# Patient Record
Sex: Female | Born: 1950 | Race: Black or African American | Hispanic: No | Marital: Married | Smoking: Former smoker
Health system: Southern US, Community
[De-identification: ages and names within clinical notes are randomized; demographics above are authoritative.]

## PROBLEM LIST (undated history)

## (undated) DIAGNOSIS — D649 Anemia, unspecified: Secondary | ICD-10-CM

## (undated) DIAGNOSIS — K5792 Diverticulitis of intestine, part unspecified, without perforation or abscess without bleeding: Secondary | ICD-10-CM

## (undated) DIAGNOSIS — E7800 Pure hypercholesterolemia, unspecified: Secondary | ICD-10-CM

## (undated) DIAGNOSIS — K219 Gastro-esophageal reflux disease without esophagitis: Secondary | ICD-10-CM

## (undated) DIAGNOSIS — I10 Essential (primary) hypertension: Secondary | ICD-10-CM

## (undated) HISTORY — DX: Essential (primary) hypertension: I10

## (undated) HISTORY — PX: PARTIAL HYSTERECTOMY: SHX80

## (undated) HISTORY — PX: CHOLECYSTECTOMY: SHX55

## (undated) HISTORY — DX: Pure hypercholesterolemia, unspecified: E78.00

## (undated) HISTORY — PX: ABDOMINAL HYSTERECTOMY: SHX81

---

## 2002-04-02 ENCOUNTER — Ambulatory Visit (HOSPITAL_COMMUNITY): Admission: RE | Admit: 2002-04-02 | Discharge: 2002-04-02 | Payer: Self-pay | Admitting: Family Medicine

## 2002-04-02 ENCOUNTER — Encounter: Payer: Self-pay | Admitting: Family Medicine

## 2004-07-21 ENCOUNTER — Ambulatory Visit (HOSPITAL_COMMUNITY): Admission: RE | Admit: 2004-07-21 | Discharge: 2004-07-21 | Payer: Self-pay | Admitting: Family Medicine

## 2008-06-05 ENCOUNTER — Ambulatory Visit (HOSPITAL_COMMUNITY): Admission: RE | Admit: 2008-06-05 | Discharge: 2008-06-05 | Payer: Self-pay | Admitting: Family Medicine

## 2010-07-28 ENCOUNTER — Ambulatory Visit (HOSPITAL_COMMUNITY): Admission: RE | Admit: 2010-07-28 | Discharge: 2010-07-28 | Payer: Self-pay | Admitting: Family Medicine

## 2010-12-16 ENCOUNTER — Encounter: Payer: Self-pay | Admitting: Orthopedic Surgery

## 2011-01-04 ENCOUNTER — Encounter: Payer: Self-pay | Admitting: Orthopedic Surgery

## 2011-01-05 ENCOUNTER — Encounter: Payer: Self-pay | Admitting: Orthopedic Surgery

## 2011-01-05 ENCOUNTER — Ambulatory Visit (INDEPENDENT_AMBULATORY_CARE_PROVIDER_SITE_OTHER): Payer: Managed Care, Other (non HMO) | Admitting: Orthopedic Surgery

## 2011-01-05 VITALS — HR 78 | Resp 16 | Ht 66.0 in | Wt 192.0 lb

## 2011-01-05 DIAGNOSIS — M23329 Other meniscus derangements, posterior horn of medial meniscus, unspecified knee: Secondary | ICD-10-CM

## 2011-01-05 DIAGNOSIS — M1712 Unilateral primary osteoarthritis, left knee: Secondary | ICD-10-CM

## 2011-01-05 MED ORDER — NABUMETONE 500 MG PO TABS: 500.0000 mg | ORAL_TABLET | Freq: Two times a day (BID) | ORAL | Status: AC

## 2011-01-05 NOTE — Progress Notes (Signed)
General: The patient is normally developed, with normal grooming and hygiene. There are no gross deformities. The body habitus is normal   CDV: The pulse and perfusion of the extremities are normal   LYMPH: There is no gross lymphadenopathy in the extremities   Skin: There are no rashes, ulcers or cafe-au-lait spot   Psyche: The patient is alert, awake and oriented.  Mood is normal   Neuro:  The coordination and balance are normal.  Sensation is normal. Reflexes are 2+ and equal   Musculoskeletal  LEFT knee exam.  Medial joint line tenderness moderate joint effusion. Flexion measured 124. Muscle tone normal strength normal. Meniscal signs negative. Knee is stable to

## 2011-01-05 NOTE — Discharge Summary (Signed)
General: The patient is normally developed, with normal grooming and hygiene. There are no gross deformities. The body habitus is normal   CDV: The pulse and perfusion of the extremities are normal   LYMPH: There is no gross lymphadenopathy in the extremities   Skin: There are no rashes, ulcers or cafe-au-lait spot   Psyche: The patient is alert, awake and oriented.  Mood is normal   Neuro:  The coordination and balance are normal.  Sensation is normal. Reflexes are 2+ and equal   Musculoskeletal   LEFT knee examination. There is tenderness along the medial join line. There is a large join effusion.124 measured. Range of motion in the LEFT knee. The knee is stable. Meniscal signs are negative. Muscle tone is normal.

## 2011-01-05 NOTE — Patient Instructions (Signed)
Start new medication, call us if you have any itching or rash and stop taking the medicine   Apply ice to the knee in the evenings, wear compression sleeve as needed   Call us of you have trouble bending your knee

## 2011-03-19 ENCOUNTER — Emergency Department (HOSPITAL_COMMUNITY)
Admission: EM | Admit: 2011-03-19 | Discharge: 2011-03-19 | Disposition: A | Discharge status: Home / Self Care | Payer: 59 | Attending: Emergency Medicine | Admitting: Emergency Medicine

## 2011-03-19 DIAGNOSIS — I10 Essential (primary) hypertension: Secondary | ICD-10-CM | POA: Insufficient documentation

## 2011-03-19 DIAGNOSIS — D649 Anemia, unspecified: Secondary | ICD-10-CM | POA: Insufficient documentation

## 2011-03-19 DIAGNOSIS — K920 Hematemesis: Secondary | ICD-10-CM | POA: Insufficient documentation

## 2011-03-19 LAB — CBC
HCT: 34.9 % — ABNORMAL LOW (ref 36.0–46.0)
Hemoglobin: 10.3 g/dL — ABNORMAL LOW (ref 12.0–15.0)
MCH: 22 pg — ABNORMAL LOW (ref 26.0–34.0)
MCHC: 29.5 g/dL — ABNORMAL LOW (ref 30.0–36.0)
MCV: 74.4 fL — ABNORMAL LOW (ref 78.0–100.0)
Platelets: 252 10*3/uL (ref 150–400)
RBC: 4.69 MIL/uL (ref 3.87–5.11)
RDW: 16 % — ABNORMAL HIGH (ref 11.5–15.5)
WBC: 7.7 10*3/uL (ref 4.0–10.5)

## 2011-03-19 LAB — BASIC METABOLIC PANEL
BUN: 16 mg/dL (ref 6–23)
CO2: 27 mEq/L (ref 19–32)
Calcium: 10.4 mg/dL (ref 8.4–10.5)
Chloride: 104 mEq/L (ref 96–112)
Creatinine, Ser: 0.76 mg/dL (ref 0.4–1.2)
GFR calc Af Amer: >60 mL/min (ref >60)
GFR calc non Af Amer: >60 mL/min (ref >60)
Glucose, Bld: 129 mg/dL — ABNORMAL HIGH (ref 70–99)
Potassium: 4 mEq/L (ref 3.5–5.1)
Sodium: 138 mEq/L (ref 135–145)

## 2012-08-09 ENCOUNTER — Encounter: Payer: Self-pay | Admitting: Gastroenterology

## 2012-08-09 ENCOUNTER — Ambulatory Visit (INDEPENDENT_AMBULATORY_CARE_PROVIDER_SITE_OTHER): Payer: 59 | Admitting: Gastroenterology

## 2012-08-09 ENCOUNTER — Other Ambulatory Visit: Payer: Self-pay | Admitting: Gastroenterology

## 2012-08-09 VITALS — BP 153/67 | HR 93 | Temp 98.7°F | Ht 66.0 in | Wt 209.4 lb

## 2012-08-09 DIAGNOSIS — R143 Flatulence: Secondary | ICD-10-CM

## 2012-08-09 DIAGNOSIS — R140 Abdominal distension (gaseous): Secondary | ICD-10-CM

## 2012-08-09 DIAGNOSIS — D509 Iron deficiency anemia, unspecified: Secondary | ICD-10-CM | POA: Insufficient documentation

## 2012-08-09 DIAGNOSIS — R195 Other fecal abnormalities: Secondary | ICD-10-CM

## 2012-08-09 DIAGNOSIS — K625 Hemorrhage of anus and rectum: Secondary | ICD-10-CM | POA: Insufficient documentation

## 2012-08-09 MED ORDER — PEG 3350-KCL-NA BICARB-NACL 420 G PO SOLR: 4000.0000 mL | ORAL | Status: DC

## 2012-08-09 NOTE — Patient Instructions (Addendum)
I will request a copy of your most recent labs from Dr. Michelle Nasuti office. If any further labs are needed we will let you know. We have scheduled you for a colonoscopy with possible upper endoscopy with Dr. Darrick Penna. Please see separate instructions.

## 2012-08-09 NOTE — Progress Notes (Signed)
Primary Care Physician:  Milana Obey, MD  Primary Gastroenterologist:  Jonette Eva, MD   Chief Complaint  Patient presents with  . Colonoscopy  . Rectal Bleeding    HPI:  Kelly Rios is a 61 y.o. female here for further evaluation of Hemoccult-positive stool, abdominal swelling/bloating, rectal bleeding.  Recently some straining to have BM. Has noted brbpr on toilet tissue and on stool. No rectal pain. No abdominal pain. Good appetite. Occasional heartburn. Tries to avoid spicy foods. No dysphagia. Some intermittent regurgitation. No prior colonoscopy. Recent blood work through [facility]. No menstrual cycle since age 63. On iron for one year, but not consistently. Looking back through [facility], she was seen in the emergency department in June 2012 for hematemesis. Patient reports episode of vomiting with minimal blood noted which occurred after taking doxycycline. She felt like the pill did not go down well and she had burning in her esophagus related to this. However when she was in ED she was noted to have microcytic anemia and at that point was placed on iron. No EGD or colonoscopy performed. Patient also complains of chronic fatigue, abdominal swelling/bloating/gas.   Current Outpatient Prescriptions  Medication Sig Dispense Refill  . bisoprolol-hydrochlorothiazide (ZIAC) 10-6.25 MG per tablet Take 1 tablet by mouth daily.      . calcium & magnesium carbonates (MYLANTA) 311-232 MG per tablet Take 1 tablet by mouth daily.      . potassium chloride (K-DUR,KLOR-CON) 10 MEQ tablet Take 10 mEq by mouth 2 (two) times daily.        Allergies as of 08/09/2012 - Review Complete 08/09/2012  Allergen Reaction Noted  . Codeine  01/05/2011    Past Medical History  Diagnosis Date  . HTN (hypertension)   . High cholesterol     Past Surgical History  Procedure Date  . Gallbladder surgery   . Partial hysterectomy     Family History  Problem Relation Age of Onset  . Asthma    . Diabetes     . Colon cancer Maternal Grandmother     8s  . Liver disease Neg Hx     History   Social History  . Marital Status: Married    Spouse Name: N/A    Number of Children: 4  . Years of Education: N/A   Occupational History  . cleaner   . picker    Social History Main Topics  . Smoking status: Former Games developer  . Smokeless tobacco: Not on file   Comment: remote  . Alcohol Use: No  . Drug Use: No  . Sexually Active: Not on file   Other Topics Concern  . Not on file   Social History Narrative  . No narrative on file      ROS:  General: Negative for anorexia, weight loss, fever, chills. Complains of fatigue, weakness. Eyes: Negative for vision changes.  ENT: Negative for hoarseness, difficulty swallowing , nasal congestion. CV: Negative for chest pain, angina, palpitations, dyspnea on exertion, peripheral edema.  Respiratory: Negative for dyspnea at rest, dyspnea on exertion, cough, sputum, wheezing.  GI: See history of present illness. GU:  Negative for dysuria, hematuria, urinary incontinence, urinary frequency, nocturnal urination.  MS: Negative for joint pain, low back pain.  Derm: Negative for rash or itching.  Neuro: Negative for weakness, abnormal sensation, seizure, frequent headaches, memory loss, confusion.  Psych: Negative for anxiety, depression, suicidal ideation, hallucinations.  Endo: Negative for unusual weight change.  Heme: Negative for bruising or bleeding. Allergy: Negative  for rash or hives.    Physical Examination:  BP 153/67  Pulse 93  Temp 98.7 F (37.1 C) (Temporal)  Ht 5\' 6"  (1.676 m)  Wt 209 lb 6.4 oz (94.983 kg)  BMI 33.80 kg/m2   General: Well-nourished, well-developed in no acute distress.  Head: Normocephalic, atraumatic.   Eyes: Conjunctiva pink, no icterus. Mouth: Oropharyngeal mucosa moist and pink , no lesions erythema or exudate. Neck: Supple without thyromegaly, masses, or lymphadenopathy.  Lungs: Clear to auscultation  bilaterally.  Heart: Regular rate and rhythm, no murmurs rubs or gallops.  Abdomen: Bowel sounds are normal, nontender, nondistended, no hepatosplenomegaly or masses, no abdominal bruits or    hernia , no rebound or guarding.   Rectal: not performed Extremities: No lower extremity edema. No clubbing or deformities.  Neuro: Alert and oriented x 4 , grossly normal neurologically.  Skin: Warm and dry, no rash or jaundice.   Psych: Alert and cooperative, normal mood and affect.  Labs:  OLD LABS FROM 03/2011 WHEN PATIENT PRESENTED TO ED WITH HEMATEMESIS.  Lab Results  Component Value Date   WBC 7.7 03/19/2011   HGB 10.3* 03/19/2011   HCT 34.9* 03/19/2011   MCV 74.4* 03/19/2011   PLT 252 03/19/2011     Imaging Studies: No results found.

## 2012-08-09 NOTE — Progress Notes (Signed)
Faxed to PCP

## 2012-08-09 NOTE — Assessment & Plan Note (Signed)
Recent Hemoccult positive stool, intermittent rectal bleeding. Interestingly patient was noted to have microcytic anemia one year ago and was placed on chronic iron therapy. Extent of workup unknown to me but she did not undergo EGD or colonoscopy at that time. There is no other signs for chronic blood loss. She has had no menstrual cycle since partial hysterectomy in her late 68s. She also complains of vague abdominal bloating and gas. She reports that there was a plan for CT scan but this has not been done yet. According to her Dr. Sudie Bailey advised her to place on hold until after procedure.  Recommend colonoscopy with possible upper endoscopy in the near future with Dr. Darrick Penna.  I have discussed the risks, alternatives, benefits with regards to but not limited to the risk of reaction to medication, bleeding, infection, perforation and the patient is agreeable to proceed. Written consent to be obtained.  I have requested copy of latest labs from PCP. If no updated H/H, we will order.

## 2012-08-10 ENCOUNTER — Other Ambulatory Visit: Payer: Self-pay | Admitting: Gastroenterology

## 2012-08-10 DIAGNOSIS — R195 Other fecal abnormalities: Secondary | ICD-10-CM

## 2012-08-10 DIAGNOSIS — R140 Abdominal distension (gaseous): Secondary | ICD-10-CM

## 2012-08-10 DIAGNOSIS — K625 Hemorrhage of anus and rectum: Secondary | ICD-10-CM

## 2012-08-18 ENCOUNTER — Encounter (HOSPITAL_COMMUNITY): Payer: Self-pay | Admitting: Pharmacy Technician

## 2012-08-25 ENCOUNTER — Encounter (HOSPITAL_COMMUNITY): Payer: Self-pay | Admitting: *Deleted

## 2012-08-25 ENCOUNTER — Encounter (HOSPITAL_COMMUNITY)
Admission: RE | Disposition: A | Discharge status: Home / Self Care | Payer: Self-pay | Source: Ambulatory Visit | Attending: Gastroenterology

## 2012-08-25 ENCOUNTER — Ambulatory Visit (HOSPITAL_COMMUNITY)
Admission: RE | Admit: 2012-08-25 | Discharge: 2012-08-25 | Disposition: A | Discharge status: Home / Self Care | Payer: 59 | Source: Ambulatory Visit | Attending: Gastroenterology | Admitting: Gastroenterology

## 2012-08-25 DIAGNOSIS — K625 Hemorrhage of anus and rectum: Secondary | ICD-10-CM

## 2012-08-25 DIAGNOSIS — D649 Anemia, unspecified: Secondary | ICD-10-CM

## 2012-08-25 DIAGNOSIS — K222 Esophageal obstruction: Secondary | ICD-10-CM | POA: Insufficient documentation

## 2012-08-25 DIAGNOSIS — K5730 Diverticulosis of large intestine without perforation or abscess without bleeding: Secondary | ICD-10-CM | POA: Insufficient documentation

## 2012-08-25 DIAGNOSIS — I10 Essential (primary) hypertension: Secondary | ICD-10-CM | POA: Insufficient documentation

## 2012-08-25 DIAGNOSIS — K2940 Chronic atrophic gastritis without bleeding: Secondary | ICD-10-CM | POA: Insufficient documentation

## 2012-08-25 DIAGNOSIS — A048 Other specified bacterial intestinal infections: Secondary | ICD-10-CM | POA: Insufficient documentation

## 2012-08-25 DIAGNOSIS — K921 Melena: Secondary | ICD-10-CM | POA: Insufficient documentation

## 2012-08-25 DIAGNOSIS — R140 Abdominal distension (gaseous): Secondary | ICD-10-CM

## 2012-08-25 DIAGNOSIS — K648 Other hemorrhoids: Secondary | ICD-10-CM | POA: Insufficient documentation

## 2012-08-25 DIAGNOSIS — R195 Other fecal abnormalities: Secondary | ICD-10-CM

## 2012-08-25 DIAGNOSIS — K257 Chronic gastric ulcer without hemorrhage or perforation: Secondary | ICD-10-CM | POA: Insufficient documentation

## 2012-08-25 DIAGNOSIS — K2990 Gastroduodenitis, unspecified, without bleeding: Secondary | ICD-10-CM

## 2012-08-25 DIAGNOSIS — K2970 Gastritis, unspecified, without bleeding: Secondary | ICD-10-CM

## 2012-08-25 HISTORY — DX: Gastro-esophageal reflux disease without esophagitis: K21.9

## 2012-08-25 HISTORY — PX: COLONOSCOPY: SHX5424

## 2012-08-25 HISTORY — DX: Anemia, unspecified: D64.9

## 2012-08-25 HISTORY — PX: ESOPHAGOGASTRODUODENOSCOPY: SHX5428

## 2012-08-25 SURGERY — COLONOSCOPY
Anesthesia: Moderate Sedation

## 2012-08-25 MED ORDER — MEPERIDINE HCL 100 MG/ML IJ SOLN
INTRAMUSCULAR | Status: AC
  Filled 2012-08-25: qty 1

## 2012-08-25 MED ORDER — STERILE WATER FOR IRRIGATION IR SOLN
Status: DC | PRN
  Administered 2012-08-25: 13:00:00

## 2012-08-25 MED ORDER — MEPERIDINE HCL 100 MG/ML IJ SOLN
INTRAMUSCULAR | Status: DC | PRN
  Administered 2012-08-25: 50 mg via INTRAVENOUS
  Administered 2012-08-25 (×2): 25 mg via INTRAVENOUS

## 2012-08-25 MED ORDER — MIDAZOLAM HCL 5 MG/5ML IJ SOLN
INTRAMUSCULAR | Status: DC | PRN
  Administered 2012-08-25 (×2): 2 mg via INTRAVENOUS
  Administered 2012-08-25: 1 mg via INTRAVENOUS

## 2012-08-25 MED ORDER — SODIUM CHLORIDE 0.45 % IV SOLN: INTRAVENOUS | Status: DC

## 2012-08-25 MED ORDER — MIDAZOLAM HCL 5 MG/5ML IJ SOLN
INTRAMUSCULAR | Status: AC
  Filled 2012-08-25: qty 10

## 2012-08-25 MED ORDER — OMEPRAZOLE 20 MG PO CPDR: DELAYED_RELEASE_CAPSULE | ORAL | Status: DC

## 2012-08-25 NOTE — Op Note (Signed)
[facility] [address] [place], [address]   COLONOSCOPY PROCEDURE REPORT  PATIENT: Kelly Rios, Kelly Rios.  MR#: [id number] BIRTHDATE: April 26, 1951 , 61  yrs. old GENDER: Female ENDOSCOPIST: Jonette Eva, MD REFERRED HY:QMVHQ Sudie Bailey, M.D. PROCEDURE DATE:  08/25/2012 PROCEDURE:   ILEOColonoscopy, screening INDICATIONS:rectal bleeding. MEDICATIONS: Demerol 75 mg IV and Versed 5 mg IV  DESCRIPTION OF PROCEDURE:    Physical exam was performed.  Informed consent was obtained from the patient after explaining the benefits, risks, and alternatives to procedure.  The patient was connected to monitor and placed in left lateral position. Continuous oxygen was provided by nasal cannula and IV medicine administered through an indwelling cannula.  After administration of sedation and rectal exam, the patients rectum was intubated and the Pentax Colonoscope [id number]  colonoscope was advanced under direct visualization to the ileum.  The scope was removed slowly by carefully examining the color, texture, anatomy, and integrity mucosa on the way out.  The patient was recovered in endoscopy and discharged home in satisfactory condition.       COLON FINDINGS: The mucosa appeared normal in the terminal ileum.  , Mild diverticulosis was noted in the ascending colon and transverse colon.  , Moderate diverticulosis was noted in the descending colon and sigmoid colon.  , The colon mucosa was otherwise normal.  , and Small internal hemorrhoids were found.  PREP QUALITY: excellent. CECAL W/D TIME: 15 minutes  COMPLICATIONS: None  ENDOSCOPIC IMPRESSION: 1.   Normal mucosa in the terminal ileum 2.   Mild diverticulosis was noted in the ascending colon and transverse colon 3.   Moderate diverticulosis was noted in the descending colon and sigmoid colon 4.   The colon mucosa was otherwise normal 5.   Small internal hemorrhoids   RECOMMENDATIONS: NO SOURCE FOR ANEMIA/HEME POS  STOOLS IDENTIFIED, PROCEED WITH EGD HIGH FIBER DIET TCS IN 10 YEARS       _______________________________ eSignedJonette Eva, MD 08/25/2012 2:12 PM     PATIENT NAME:  Kelly Rios. MR#: [id number]

## 2012-08-25 NOTE — H&P (Signed)
  Primary Care Physician:  Milana Obey, MD Primary Gastroenterologist:  Dr. Darrick Penna  Pre-Procedure History & Physical: HPI:  Kelly Rios is a 61 y.o. female here for  BRBPR/ANEMIA/HEMATEMESIS.  Past Medical History  Diagnosis Date  . HTN (hypertension)   . High cholesterol   . Anemia   . GERD (gastroesophageal reflux disease)     Past Surgical History  Procedure Date  . Partial hysterectomy   . Abdominal hysterectomy   . Cholecystectomy     Prior to Admission medications   Medication Sig Start Date End Date Taking? Authorizing Provider  bisoprolol-hydrochlorothiazide (ZIAC) 10-6.25 MG per tablet Take 1 tablet by mouth daily.   Yes Historical Provider, MD  calcium & magnesium carbonates (MYLANTA) 311-232 MG per tablet Take 1 tablet by mouth daily.   Yes Historical Provider, MD  cyclobenzaprine (FLEXERIL) 10 MG tablet Take 10 mg by mouth 3 (three) times daily as needed. Muscle spasms.   Yes Historical Provider, MD  ferrous sulfate 325 (65 FE) MG tablet Take 325 mg by mouth daily with breakfast.   Yes Historical Provider, MD  Homeopathic Products ([employer] LEG CRAMPS PAIN RELIEF PO) Take 2 tablets by mouth every 3 (three) hours as needed. Leg cramps   Yes Historical Provider, MD  potassium chloride (K-DUR,KLOR-CON) 10 MEQ tablet Take 10 mEq by mouth 2 (two) times daily.   Yes Historical Provider, MD  predniSONE (DELTASONE) 10 MG tablet Take 10 mg by mouth 3 (three) times daily.   Yes Historical Provider, MD    Allergies as of 08/10/2012 - Review Complete 08/09/2012  Allergen Reaction Noted  . Codeine  01/05/2011    Family History  Problem Relation Age of Onset  . Asthma    . Diabetes    . Colon cancer Maternal Grandmother     65s  . Liver disease Neg Hx     History   Social History  . Marital Status: Married    Spouse Name: N/A    Number of Children: 4  . Years of Education: N/A   Occupational History  . cleaner   . picker    Social History Main Topics  .  Smoking status: Former Smoker -- 0.5 packs/day for 15 years    Types: Cigarettes  . Smokeless tobacco: Not on file     Comment: remote  . Alcohol Use: No  . Drug Use: No  . Sexually Active: Not on file   Other Topics Concern  . Not on file   Social History Narrative  . No narrative on file    Review of Systems: See HPI, otherwise negative ROS   Physical Exam: BP 173/80  Pulse 74  Temp 97.6 F (36.4 C) (Oral)  Resp 14  Ht 5\' 6"  (1.676 m)  Wt 209 lb (94.802 kg)  BMI 33.73 kg/m2  SpO2 97% General:   Alert,  pleasant and cooperative in NAD Head:  Normocephalic and atraumatic. Neck:  Supple; Lungs:  Clear throughout to auscultation.    Heart:  Regular rate and rhythm. Abdomen:  Soft, nontender and nondistended. Normal bowel sounds, without guarding, and without rebound.   Neurologic:  Alert and  oriented x4;  grossly normal neurologically.  Impression/Plan:     BRBPR/hematemesis sml volume.  PLAN: EGD/TCS TODAY

## 2012-08-25 NOTE — Op Note (Signed)
[facility] [address] [place], [address]   ENDOSCOPY PROCEDURE REPORT  PATIENT: Carolette, Delrio.  MR#: [id number] BIRTHDATE: 28-Jun-1951 , 61  yrs. old GENDER: Female  ENDOSCOPIST: Jonette Eva, MD REFERRED QI:ONGEX Sudie Bailey, M.D.  PROCEDURE DATE: 08/25/2012 PROCEDURE:   EGD w/ biopsy  INDICATIONS:ANEMIA, HEME POS STOOLS-PMHx: CRI. MEDICATIONS: Demerol 25 mg IV TOPICAL ANESTHETIC:   Cetacaine Spray  DESCRIPTION OF PROCEDURE:     Physical exam was performed.  Informed consent was obtained from the patient after explaining the benefits, risks, and alternatives to the procedure.  The patient was connected to the monitor and placed in the left lateral position.  Continuous oxygen was provided by nasal cannula and IV medicine administered through an indwelling cannula.  After administration of sedation, the patients esophagus was intubated and the [id number] ([id number])  endoscope was advanced under direct visualization to the second portion of the duodenum.  The scope was removed slowly by carefully examining the color, texture, anatomy, and integrity of the mucosa on the way out.  The patient was recovered in endoscopy and discharged home in satisfactory condition.      ESOPHAGUS: A stricture was found at the gastroesophageal junction. The stenosis was traversable with the endoscope.  STOMACH: Multiple medium sized non-bleeding linear ulcers were found in the gastric fundus and gastric body.  Biopsies were taken around the ulcers.   Moderate non-erosive gastritis (inflammation) was found in the gastric antrum.  Multiple biopsies were performed.  DUODENUM: Mild duodenal inflammation was found in the bulb and second portion of the duodenum. COMPLICATIONS:   None  ENDOSCOPIC IMPRESSION: 1.   Stricture was found at the gastroesophageal junction 2.   Multiple medium sized non-bleeding ulcers were found in the gastric fundus and gastric body 3.   Non-erosive  gastritis (inflammation) was found in the gastric antrum; multiple biopsies 4.   Duodenal inflammation was found in the bulb and second portion of the duodenum  RECOMMENDATIONS: ANEMIA/HEME POS STOOLS MOST LIKELY DUE TO CAMERON'S ULCERS. DIFFERENTIAL DIAGNSOSIS INCLUDES NSAID INDUCED PUD OR H PYLORI INFECTION.  OMEPRAZOLE QD AWAIT BIOPSY LOW FAT/HIGH FIBER DIET LOSE 30 LBS. OPV IN 3 MOS. CALL IF DEVELOPS DYSPHAGIA AND WILL ARANGE FOR EGD/DIL.   REPEAT EXAM:   _______________________________ Rosalie DoctorJonette Eva, MD 08/25/2012 3:18 PM       PATIENT NAME:  [name]. MR#: [id number]

## 2012-08-30 ENCOUNTER — Telehealth: Payer: Self-pay | Admitting: Gastroenterology

## 2012-08-30 ENCOUNTER — Encounter (HOSPITAL_COMMUNITY): Payer: Self-pay | Admitting: Gastroenterology

## 2012-08-30 MED ORDER — CLARITHROMYCIN 500 MG PO TABS: ORAL_TABLET | ORAL | Status: DC

## 2012-08-30 MED ORDER — AMOXICILLIN 500 MG PO CAPS: ORAL_CAPSULE | ORAL | Status: DC

## 2012-08-30 NOTE — Progress Notes (Signed)
EGD/TCS NOV 2013 H PYLORI  REVIEWED.

## 2012-08-30 NOTE — Telephone Encounter (Signed)
Results faxed to PCP, recall made  

## 2012-08-30 NOTE — Telephone Encounter (Signed)
LM for pt to call

## 2012-08-30 NOTE — Telephone Encounter (Signed)
PLEASE CALL PT.  Her stomach Bx showed H. Pylori infection. THIS IS CONTRIBUTING TO HER LOW BLOOD COUNT. She needs AMOXICILLIN 500 mg 2 po BID for 10 days and Biaxin 500 mg po bid for 10 days. RX FOR ABX SNE TO [facility]. She needs Omeprazole 20 mg BID for 10 days then 1 po QD. Med side effects include NVD, abd pain, and metallic taste.  OPV IN 3 MO W/ SLF ANEMIA.

## 2012-08-31 NOTE — Telephone Encounter (Signed)
Pt called and was informed of results.  

## 2012-08-31 NOTE — Telephone Encounter (Signed)
LMOM to call.

## 2016-09-14 ENCOUNTER — Other Ambulatory Visit: Payer: Self-pay | Admitting: *Deleted

## 2016-09-14 DIAGNOSIS — I83893 Varicose veins of bilateral lower extremities with other complications: Secondary | ICD-10-CM

## 2016-09-29 ENCOUNTER — Other Ambulatory Visit: Payer: Self-pay | Admitting: *Deleted

## 2016-09-29 DIAGNOSIS — I83813 Varicose veins of bilateral lower extremities with pain: Secondary | ICD-10-CM

## 2016-10-08 ENCOUNTER — Encounter: Payer: Self-pay | Admitting: Vascular Surgery

## 2016-10-26 ENCOUNTER — Ambulatory Visit (HOSPITAL_COMMUNITY)
Admission: RE | Admit: 2016-10-26 | Discharge: 2016-10-26 | Disposition: A | Discharge status: Home / Self Care | Payer: BLUE CROSS/BLUE SHIELD | Source: Ambulatory Visit | Attending: Vascular Surgery | Admitting: Vascular Surgery

## 2016-10-26 ENCOUNTER — Encounter: Payer: Self-pay | Admitting: Vascular Surgery

## 2016-10-26 ENCOUNTER — Ambulatory Visit (INDEPENDENT_AMBULATORY_CARE_PROVIDER_SITE_OTHER): Payer: BLUE CROSS/BLUE SHIELD | Admitting: Vascular Surgery

## 2016-10-26 VITALS — BP 148/83 | HR 62 | Temp 98.5°F | Resp 18 | Ht 66.0 in | Wt 200.0 lb

## 2016-10-26 DIAGNOSIS — I83893 Varicose veins of bilateral lower extremities with other complications: Secondary | ICD-10-CM

## 2016-10-26 NOTE — Progress Notes (Signed)
Subjective:     Patient ID: Kelly Rios, female   DOB: 07/14/1951, 66 y.o.   MRN: [id number]  HPI This 66 year old female was referred by Dr. Gareth MorganSteve Knowlton for evaluation of bilateral painful varicosities. Patient has had no recent treatment. She did have localized removal of the varicose vein in her right calf Dr. Shon Houghruesdale many years ago-greater than 20. She also went to [facility] to be evaluated and was found to be out of insurance network. She has aching throbbing and burning discomfort in both legs which worsens as the day progresses. She does wear a long leg elastic compression stockings 20-30 millimeter gradient which she purchased at [facility] and has been wearing these for greater than 3 months. He continues to have symptoms despite the stockings. She has no history of DVT thrombophlebitis stasis ulcers or bleeding. This is affecting her daily living. When she works she stands all day and this causes her symptoms do worsen as the day progresses.  Past Medical History:  Diagnosis Date  . Anemia   . GERD (gastroesophageal reflux disease)   . High cholesterol   . HTN (hypertension)     Social History  Substance Use Topics  . Smoking status: Former Smoker    Packs/day: 0.50    Years: 15.00    Types: Cigarettes  . Smokeless tobacco: Never Used     Comment: remote  . Alcohol use No    Family History  Problem Relation Age of Onset  . Asthma    . Diabetes    . Colon cancer Maternal Grandmother     5880s  . Liver disease Neg Hx     Allergies  Allergen Reactions  . Codeine Shortness Of Breath     Current Outpatient Prescriptions:  .  bisoprolol-hydrochlorothiazide (ZIAC) 10-6.25 MG per tablet, Take 1 tablet by mouth daily., Disp: , Rfl:  .  calcium & magnesium carbonates (MYLANTA) 311-232 MG per tablet, Take 1 tablet by mouth daily., Disp: , Rfl:  .  cyclobenzaprine (FLEXERIL) 10 MG tablet, Take 10 mg by mouth 3 (three) times daily as needed. Muscle spasms., Disp: ,  Rfl:  .  ferrous sulfate 325 (65 FE) MG tablet, Take 325 mg by mouth daily with breakfast., Disp: , Rfl:  .  Homeopathic Products ([employer] LEG CRAMPS PAIN RELIEF PO), Take 2 tablets by mouth every 3 (three) hours as needed. Leg cramps, Disp: , Rfl:  .  omeprazole (PRILOSEC) 20 MG capsule, 1 po every morning, Disp: 31 capsule, Rfl: 11 .  potassium chloride (K-DUR,KLOR-CON) 10 MEQ tablet, Take 10 mEq by mouth 2 (two) times daily., Disp: , Rfl:  .  amoxicillin (AMOXIL) 500 MG capsule, 2 PO BID FOR 10 DAYS (Patient not taking: Reported on 10/26/2016), Disp: 40 capsule, Rfl: 0 .  clarithromycin (BIAXIN) 500 MG tablet, 1 PO BID FOR 10 DAYS. (Patient not taking: Reported on 10/26/2016), Disp: 20 tablet, Rfl: 0 .  predniSONE (DELTASONE) 10 MG tablet, Take 10 mg by mouth 3 (three) times daily., Disp: , Rfl:   Vitals:   10/26/16 1450 10/26/16 1455  BP: (!) 150/84 (!) 148/83  Pulse: 62   Resp: 18   Temp: 98.5 F (36.9 C)   TempSrc: Oral   SpO2: 96%   Weight: 200 lb (90.7 kg)   Height: 5\' 6"  (1.676 m)     Body mass index is 32.28 kg/m.         Review of Systems Denies chest pain, dyspnea on exertion, PND, orthopnea,  hemoptysis. Patient does have hypertension, hyperlipidemia, diabetes mellitus type 2. Has history of anxiety disorder, other systems negative and complete review of systems    Objective:   Physical Exam BP (!) 148/83 Comment: recheck  Pulse 62   Temp 98.5 F (36.9 C) (Oral)   Resp 18   Ht 5\' 6"  (1.676 m)   Wt 200 lb (90.7 kg)   SpO2 96%   BMI 32.28 kg/m     Gen.-alert and oriented x3 in no apparent distress HEENT normal for age Lungs no rhonchi or wheezing Cardiovascular regular rhythm no murmurs carotid pulses 3+ palpable no bruits audible Abdomen soft nontender no palpable masses Musculoskeletal free of  major deformities Skin clear -no rashes Neurologic normal Lower extremities 3+ femoral and dorsalis pedis pulses palpable bilaterally with 1+ edema  bilaterally Left leg with large bulging varicosities beginning in the mid thigh extending laterally distally and around the knee and into the lateral calf with extensive spider and reticular veins in the lower leg down to the malleolar areas. No hyperpigmentation or ulceration noted. Right leg with similar distribution of bulging varicosities medial thigh and large bulge in medial posterior calf with extensive reticular and spider veins below the knee but no active ulcer  Today I ordered a venous duplex exam both legs which I reviewed and interpreted. 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 The symptoms are resistant to conservative measures including long-leg elastic compression stockings 20-30 millimeter gradient, elevation, and ibuprofen    Plan:     Patient needs #1 laser ablation left great saphenous vein plus greater than 20 stab phlebectomy followed by 2 courses of sclerotherapy. She also needs #2 laser ablation right great saphenous vein plus greater than 20 stab phlebectomy followed by 2 courses of sclerotherapy  We will proceed with precertification to form this in the near future and relieve her symptoms.

## 2016-10-27 ENCOUNTER — Telehealth: Payer: Self-pay | Admitting: *Deleted

## 2016-10-27 NOTE — Telephone Encounter (Signed)
I checked the patient's benefits per her request to see what the procedures would cost. After telling the patient my findings, she has decided to wait until the summer when she is on Medicare and has a supplement. We will make her a three month fu visit in June.

## 2017-03-23 ENCOUNTER — Encounter: Payer: Self-pay | Admitting: Vascular Surgery

## 2017-03-28 ENCOUNTER — Ambulatory Visit (INDEPENDENT_AMBULATORY_CARE_PROVIDER_SITE_OTHER): Payer: BLUE CROSS/BLUE SHIELD | Admitting: Vascular Surgery

## 2017-03-28 ENCOUNTER — Encounter: Payer: Self-pay | Admitting: Vascular Surgery

## 2017-03-28 VITALS — BP 157/77 | HR 67 | Temp 97.6°F | Resp 16 | Ht 60.6 in | Wt 200.0 lb

## 2017-03-28 DIAGNOSIS — I83893 Varicose veins of bilateral lower extremities with other complications: Secondary | ICD-10-CM | POA: Diagnosis not present

## 2017-03-28 NOTE — Progress Notes (Signed)
Vitals:   03/28/17 1414  BP: (!) 174/83  Pulse: 67  Resp: 16  Temp: 97.6 F (36.4 C)  SpO2: 98%  Weight: 200 lb (90.7 kg)  Height: 5' 0.6" (1.539 m)

## 2017-03-28 NOTE — Progress Notes (Signed)
Subjective:     Patient ID: Kelly Rios, female   DOB: Jan 28, 1951, 66 y.o.   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 She continues to have aching throbbing and burning discomfort in both legs as the day progresses despite long leg elastic compression stockings 20-30 millimeter gradient, elevation, and ibuprofen which she has been wearing for greater than 6 months. She was previously evaluated by [facility] but was found to be out of network. Her symptoms are affecting her daily living and she would like treatment.  Past Medical History:  Diagnosis Date  . Anemia   . GERD (gastroesophageal reflux disease)   . High cholesterol   . HTN (hypertension)     Social History  Substance Use Topics  . Smoking status: Former Smoker    Packs/day: 0.50    Years: 15.00    Types: Cigarettes  . Smokeless tobacco: Never Used     Comment: remote  . Alcohol use No    Family History  Problem Relation Age of Onset  . Asthma Unknown   . Diabetes Unknown   . Colon cancer Maternal Grandmother        49s  . Liver disease Neg Hx     Allergies  Allergen Reactions  . Codeine Shortness Of Breath     Current Outpatient Prescriptions:  .  amoxicillin (AMOXIL) 500 MG capsule, 2 PO BID FOR 10 DAYS (Patient not taking: Reported on 10/26/2016), Disp: 40 capsule, Rfl: 0 .  bisoprolol-hydrochlorothiazide (ZIAC) 10-6.25 MG per tablet, Take 1 tablet by mouth daily., Disp: , Rfl:  .  calcium & magnesium carbonates (MYLANTA) 311-232 MG per tablet, Take 1 tablet by mouth daily., Disp: , Rfl:  .  clarithromycin (BIAXIN) 500 MG tablet, 1 PO BID FOR 10 DAYS. (Patient not taking: Reported on 10/26/2016), Disp: 20 tablet, Rfl: 0 .  cyclobenzaprine (FLEXERIL) 10 MG tablet, Take 10 mg by mouth 3 (three) times daily as needed. Muscle spasms., Disp: , Rfl:  .  ferrous sulfate 325 (65 FE) MG tablet, Take 325 mg by mouth daily  with breakfast., Disp: , Rfl:  .  Homeopathic Products ([employer] LEG CRAMPS PAIN RELIEF PO), Take 2 tablets by mouth every 3 (three) hours as needed. Leg cramps, Disp: , Rfl:  .  omeprazole (PRILOSEC) 20 MG capsule, 1 po every morning, Disp: 31 capsule, Rfl: 11 .  potassium chloride (K-DUR,KLOR-CON) 10 MEQ tablet, Take 10 mEq by mouth 2 (two) times daily., Disp: , Rfl:  .  predniSONE (DELTASONE) 10 MG tablet, Take 10 mg by mouth 3 (three) times daily., Disp: , Rfl:   There were no vitals filed for this visit.  There is no height or weight on file to calculate BMI.         Review of Systems Has history of GERD, hypertension, hyperlipidemia. Denies chest pain, dyspnea on exertion, orthopnea    Objective:   Physical Exam There were no vitals taken for this visit.  Gen. well-developed well-nourished female no apparent distress alert and oriented 3 Lungs clear to auscultation with no rhonchi Right leg with extensive bulging varicosities in the medial thigh and calf and 1+ distal edema Left leg with extensive bulging varicosities in the distal thigh medially around the knee area and into the medial calf below the knee with 1+ edema No active ulcer bilaterally but early hyperpigmentation in both legs lower third  Today I reviewed the reflux exam  from previous studies which revealed a large caliber great saphenous vein bilaterally with gross reflux supplying these painful varicosities    Assessment:     Bilateral painful varicosities with distal edema causing pain which is resistant to conservative measures including long-leg elastic compression stockings 20-30 millimeter gradient, elevation, and ibuprofen. This is affecting her ability to work at [employer] where she stands 48 hours. Is also affecting her daily living.CEAP3    Plan:     Patient needs #1 laser ablation right great saphenous vein followed by #2 laser ablation left great saphenous vein. She will then have a reevaluation in 3  months for possible stab phlebectomy of residual painful varicosities We'll proceed with precertification to perform this in the near future

## 2017-03-29 ENCOUNTER — Ambulatory Visit: Payer: BLUE CROSS/BLUE SHIELD | Admitting: Vascular Surgery

## 2017-04-04 ENCOUNTER — Ambulatory Visit: Payer: BLUE CROSS/BLUE SHIELD | Admitting: Vascular Surgery

## 2017-04-12 ENCOUNTER — Ambulatory Visit: Payer: BLUE CROSS/BLUE SHIELD | Admitting: Vascular Surgery

## 2017-04-19 ENCOUNTER — Ambulatory Visit: Payer: BLUE CROSS/BLUE SHIELD | Admitting: Vascular Surgery

## 2017-04-25 ENCOUNTER — Other Ambulatory Visit: Payer: Self-pay | Admitting: *Deleted

## 2017-04-25 DIAGNOSIS — I83893 Varicose veins of bilateral lower extremities with other complications: Secondary | ICD-10-CM

## 2017-04-27 ENCOUNTER — Encounter: Payer: Self-pay | Admitting: Vascular Surgery

## 2017-05-04 ENCOUNTER — Other Ambulatory Visit: Payer: BLUE CROSS/BLUE SHIELD | Admitting: Vascular Surgery

## 2017-05-10 ENCOUNTER — Encounter (HOSPITAL_COMMUNITY): Payer: BLUE CROSS/BLUE SHIELD

## 2017-05-10 ENCOUNTER — Ambulatory Visit: Payer: BLUE CROSS/BLUE SHIELD | Admitting: Vascular Surgery

## 2018-05-05 ENCOUNTER — Other Ambulatory Visit: Payer: Self-pay

## 2018-05-05 DIAGNOSIS — I83893 Varicose veins of bilateral lower extremities with other complications: Secondary | ICD-10-CM

## 2018-05-10 ENCOUNTER — Encounter (HOSPITAL_COMMUNITY): Payer: Self-pay | Admitting: *Deleted

## 2018-05-10 ENCOUNTER — Emergency Department (HOSPITAL_COMMUNITY)
Admission: EM | Admit: 2018-05-10 | Discharge: 2018-05-10 | Disposition: A | Discharge status: Home / Self Care | Payer: Medicare Other | Attending: Emergency Medicine | Admitting: Emergency Medicine

## 2018-05-10 ENCOUNTER — Emergency Department (HOSPITAL_COMMUNITY): Payer: Medicare Other

## 2018-05-10 ENCOUNTER — Other Ambulatory Visit: Payer: Self-pay

## 2018-05-10 DIAGNOSIS — Z87891 Personal history of nicotine dependence: Secondary | ICD-10-CM | POA: Diagnosis not present

## 2018-05-10 DIAGNOSIS — I10 Essential (primary) hypertension: Secondary | ICD-10-CM | POA: Diagnosis not present

## 2018-05-10 DIAGNOSIS — K5792 Diverticulitis of intestine, part unspecified, without perforation or abscess without bleeding: Secondary | ICD-10-CM | POA: Insufficient documentation

## 2018-05-10 DIAGNOSIS — R110 Nausea: Secondary | ICD-10-CM

## 2018-05-10 DIAGNOSIS — Z79899 Other long term (current) drug therapy: Secondary | ICD-10-CM | POA: Insufficient documentation

## 2018-05-10 DIAGNOSIS — R1084 Generalized abdominal pain: Secondary | ICD-10-CM | POA: Diagnosis present

## 2018-05-10 DIAGNOSIS — R109 Unspecified abdominal pain: Secondary | ICD-10-CM

## 2018-05-10 HISTORY — DX: Diverticulitis of intestine, part unspecified, without perforation or abscess without bleeding: K57.92

## 2018-05-10 LAB — COMPREHENSIVE METABOLIC PANEL
ALK PHOS: 89 U/L (ref 38–126)
ALT: 21 U/L (ref 0–44)
ANION GAP: 8 (ref 5–15)
AST: 18 U/L (ref 15–41)
Albumin: 3.6 g/dL (ref 3.5–5.0)
BUN: 16 mg/dL (ref 8–23)
CALCIUM: 9.4 mg/dL (ref 8.9–10.3)
CHLORIDE: 104 mmol/L (ref 98–111)
CO2: 27 mmol/L (ref 22–32)
Creatinine, Ser: 0.78 mg/dL (ref 0.44–1.00)
GFR calc non Af Amer: >60 mL/min (ref >60)
Glucose, Bld: 129 mg/dL — ABNORMAL HIGH (ref 70–99)
Potassium: 4.1 mmol/L (ref 3.5–5.1)
SODIUM: 139 mmol/L (ref 135–145)
Total Bilirubin: 0.7 mg/dL (ref 0.3–1.2)
Total Protein: 7.4 g/dL (ref 6.5–8.1)

## 2018-05-10 LAB — LIPASE, BLOOD: Lipase: 23 U/L (ref 11–51)

## 2018-05-10 LAB — CBC WITH DIFFERENTIAL/PLATELET
Basophils Absolute: 0 10*3/uL (ref 0.0–0.1)
Basophils Relative: 1 %
EOS ABS: 0.1 10*3/uL (ref 0.0–0.7)
EOS PCT: 2 %
HCT: 43.6 % (ref 36.0–46.0)
Hemoglobin: 14.4 g/dL (ref 12.0–15.0)
LYMPHS ABS: 1.8 10*3/uL (ref 0.7–4.0)
Lymphocytes Relative: 25 %
MCH: 30.3 pg (ref 26.0–34.0)
MCHC: 33 g/dL (ref 30.0–36.0)
MCV: 91.8 fL (ref 78.0–100.0)
MONO ABS: 0.7 10*3/uL (ref 0.1–1.0)
MONOS PCT: 10 %
Neutro Abs: 4.3 10*3/uL (ref 1.7–7.7)
Neutrophils Relative %: 62 %
PLATELETS: 224 10*3/uL (ref 150–400)
RBC: 4.75 MIL/uL (ref 3.87–5.11)
RDW: 13.7 % (ref 11.5–15.5)
WBC: 6.9 10*3/uL (ref 4.0–10.5)

## 2018-05-10 MED ORDER — DICYCLOMINE HCL 20 MG PO TABS: 20.0000 mg | ORAL_TABLET | Freq: Two times a day (BID) | ORAL | 0 refills | Status: DC | PRN

## 2018-05-10 MED ORDER — ONDANSETRON HCL 4 MG PO TABS: 4.0000 mg | ORAL_TABLET | Freq: Four times a day (QID) | ORAL | 0 refills | Status: DC

## 2018-05-10 MED ORDER — IOPAMIDOL (ISOVUE-300) INJECTION 61%
100.0000 mL | Freq: Once | INTRAVENOUS | Status: AC | PRN
  Administered 2018-05-10: 100 mL via INTRAVENOUS

## 2018-05-10 MED ORDER — ONDANSETRON HCL 4 MG/2ML IJ SOLN
4.0000 mg | Freq: Once | INTRAMUSCULAR | Status: AC
  Administered 2018-05-10: 4 mg via INTRAVENOUS
  Filled 2018-05-10: qty 2

## 2018-05-10 NOTE — ED Provider Notes (Signed)
[facility] EMERGENCY DEPARTMENT Provider Note   CSN: [id number] Arrival date & time: 05/10/18  1109     History   Chief Complaint Chief Complaint  Patient presents with  . Nausea  . Diarrhea    HPI Kelly Rios is a 67 y.o. female.  HPI Patient states she has recurrent diverticulitis and is currently being treated with Cipro and Flagyl started by her primary physician on 7/22.  She is had ongoing abdominal cramping, nausea and 6 episodes of loose stool today.  No gross blood in the stool.  States that she has had subjective fevers and chills.  Had a bowel movement earlier and her abdominal pain is improved. Past Medical History:  Diagnosis Date  . Anemia   . Diverticulitis   . GERD (gastroesophageal reflux disease)   . High cholesterol   . HTN (hypertension)     Patient Active Problem List   Diagnosis Date Noted  . Varicose veins of bilateral lower extremities with other complications 10/26/2016  . Rectal bleeding 08/09/2012  . Microcytic anemia 08/09/2012  . Heme positive stool 08/09/2012  . Abdominal bloating 08/09/2012    Past Surgical History:  Procedure Laterality Date  . ABDOMINAL HYSTERECTOMY    . CHOLECYSTECTOMY    . COLONOSCOPY  08/25/2012   Procedure: COLONOSCOPY;  Surgeon: West Bali, MD;  [facility];  Service: Endoscopy;  Laterality: N/A;  12:30-changed to 12:45 Soledad Gerlach notified  . ESOPHAGOGASTRODUODENOSCOPY  08/25/2012   Procedure: ESOPHAGOGASTRODUODENOSCOPY (EGD);  Surgeon: West Bali, MD;  Location: [facility];  Service: Endoscopy;  Laterality: N/A;  . PARTIAL HYSTERECTOMY       OB History   None      Home Medications    Prior to Admission medications   Medication Sig Start Date End Date Taking? Authorizing Provider  bisoprolol-hydrochlorothiazide (ZIAC) 10-6.25 MG per tablet Take 1 tablet by mouth daily.    [provider]  calcium & magnesium carbonates (MYLANTA) 311-232 MG per tablet Take 1 tablet by  mouth daily.    [provider]  cefUROXime (CEFTIN) 500 MG tablet  12/24/16   [provider]  ciprofloxacin (CIPRO) 500 MG tablet Take 1 tablet by mouth 2 (two) times daily. 05/08/18   [provider]  cyclobenzaprine (FLEXERIL) 10 MG tablet Take 10 mg by mouth 3 (three) times daily as needed. Muscle spasms.    [provider]  dicyclomine (BENTYL) 20 MG tablet Take 1 tablet (20 mg total) by mouth 2 (two) times daily as needed for spasms. 05/10/18   Loren Racer, MD  ferrous sulfate 325 (65 FE) MG tablet Take 325 mg by mouth daily with breakfast.    [provider]  Homeopathic Products ([employer] LEG CRAMPS PAIN RELIEF PO) Take 2 tablets by mouth every 3 (three) hours as needed. Leg cramps    [provider]  metroNIDAZOLE (FLAGYL) 500 MG tablet  12/24/16   [provider]  ondansetron (ZOFRAN) 4 MG tablet Take 1 tablet (4 mg total) by mouth every 6 (six) hours. 05/10/18   Loren Racer, MD  potassium chloride (K-DUR,KLOR-CON) 10 MEQ tablet Take 10 mEq by mouth 2 (two) times daily.    [provider]  potassium chloride SA (K-DUR,KLOR-CON) 20 MEQ tablet  01/23/17   [provider]  predniSONE (DELTASONE) 10 MG tablet Take 10 mg by mouth 3 (three) times daily.    [provider]    Family History Family History  Problem Relation Age  of Onset  . Asthma Unknown   . Diabetes Unknown   . Colon cancer Maternal Grandmother        32s  . Liver disease Neg Hx     Social History Social History   Tobacco Use  . Smoking status: Former Smoker    Packs/day: 0.50    Years: 15.00    Pack years: 7.50    Types: Cigarettes  . Smokeless tobacco: Never Used  . Tobacco comment: remote  Substance Use Topics  . Alcohol use: No  . Drug use: No     Allergies   Codeine   Review of Systems Review of Systems  Constitutional: Positive for chills and fever.  HENT: Negative for sore throat and trouble swallowing.     Respiratory: Negative for shortness of breath.   Cardiovascular: Negative for chest pain.  Gastrointestinal: Positive for abdominal pain, diarrhea and nausea. Negative for blood in stool, constipation and vomiting.  Genitourinary: Negative for dysuria, flank pain and frequency.  Musculoskeletal: Negative for back pain, myalgias and neck pain.  Skin: Negative for rash and wound.  Neurological: Negative for dizziness, weakness, light-headedness, numbness and headaches.  All other systems reviewed and are negative.    Physical Exam Updated Vital Signs BP (!) 149/68   Pulse 74   Temp 97.6 F (36.4 C) (Oral)   Resp 16   Ht 5\' 6"  (1.676 m)   Wt 94.2 kg (207 lb 11.2 oz)   SpO2 95%   BMI 33.52 kg/m   Physical Exam  Constitutional: She is oriented to person, place, and time. She appears well-developed and well-nourished. No distress.  HENT:  Head: Normocephalic and atraumatic.  Mouth/Throat: Oropharynx is clear and moist.  Eyes: Pupils are equal, round, and reactive to light. EOM are normal.  Neck: Normal range of motion. Neck supple.  Cardiovascular: Normal rate and regular rhythm.  Pulmonary/Chest: Effort normal and breath sounds normal.  Abdominal: Soft. Bowel sounds are normal. There is tenderness. There is no rebound and no guarding.  Mild tenderness to deep palpation in the left lower quadrant.  No rebound or guarding.  Musculoskeletal: Normal range of motion. She exhibits no edema or tenderness.  No CVA tenderness bilaterally.  No lower extremity swelling, asymmetry or tenderness.  Neurological: She is alert and oriented to person, place, and time.  Moves all extremities without focal deficit.  Sensation intact.  Skin: Skin is warm and dry. Capillary refill takes less than 2 seconds. No rash noted. She is not diaphoretic. No erythema.  Psychiatric: She has a normal mood and affect. Her behavior is normal.  Nursing note and vitals reviewed.    ED Treatments / Results   Labs (all labs ordered are listed, but only abnormal results are displayed) Labs Reviewed  COMPREHENSIVE METABOLIC PANEL - Abnormal; Notable for the following components:      Result Value   Glucose, Bld 129 (*)    All other components within normal limits  CBC WITH DIFFERENTIAL/PLATELET  LIPASE, BLOOD    EKG None  Radiology Ct Abdomen Pelvis W Contrast  Result Date: 05/10/2018 CLINICAL DATA:  Nausea, vomiting and pulling sensation in the abdomen since 05/08/2018. History of diverticulitis. EXAM: CT ABDOMEN AND PELVIS WITH CONTRAST TECHNIQUE: Multidetector CT imaging of the abdomen and pelvis was performed using the standard protocol following bolus administration of intravenous contrast. CONTRAST:  ISOVUE-300 IOPAMIDOL (ISOVUE-300) INJECTION 61% COMPARISON:  Pelvic ultrasound dated 07/21/2004. FINDINGS: Lower chest: 5 mm right lower lobe subpleural nodule on image  number 12 series 7 and 2 mm right lower lobe subpleural nodule on image number 13 of series 7. Hepatobiliary: Cholecystectomy clips. Post cholecystectomy intrahepatic biliary ductal dilatation. Pancreas: Unremarkable. No pancreatic ductal dilatation or surrounding inflammatory changes. Spleen: Normal in size without focal abnormality. Adrenals/Urinary Tract: Adrenal glands are unremarkable. Kidneys are normal, without renal calculi, focal lesion, or hydronephrosis. Bladder is unremarkable. Stomach/Bowel: Large hiatal hernia. Mildly dilated small bowel in the left mid upper abdomen. Multiple descending and sigmoid colon diverticula. Low density wall thickening and pericolonic soft tissue stranding involving the proximal sigmoid colon with no fluid collections or free peritoneal air. Normal appearing appendix. Vascular/Lymphatic: Atheromatous arterial calcifications without aneurysm. No enlarged lymph nodes. Reproductive: Status post hysterectomy. No adnexal masses. Other: No abdominal wall hernia or abnormality. No abdominopelvic  ascites. Musculoskeletal: Lumbar and lower thoracic spine degenerative changes. IMPRESSION: 1. Acute proximal sigmoid diverticulitis without abscess. 2. Extensive sigmoid and descending colon diverticulosis. 3. Mild reactive small bowel ileus. 4. **An incidental finding of potential clinical significance has been found. 5 mm and 2 mm right lower lobe subpleural nodules. No follow-up needed if patient is low-risk (and has no known or suspected primary neoplasm). Non-contrast chest CT can be considered in 12 months if patient is high-risk. This recommendation follows the consensus statement: Guidelines for Management of Incidental Pulmonary Nodules Detected on CT Images: From the Fleischner Society 2017; Radiology 2017; 284:228-243.** 5. Large hiatal hernia. Electronically Signed   By: Beckie SaltsSteven  Reid M.D.   On: 05/10/2018 13:18    Procedures Procedures (including critical care time)  Medications Ordered in ED Medications  iopamidol (ISOVUE-300) 61 % injection 100 mL (100 mLs Intravenous Contrast Given 05/10/18 1301)  ondansetron (ZOFRAN) injection 4 mg (4 mg Intravenous Given 05/10/18 1413)     Initial Impression / Assessment and Plan / ED Course  I have reviewed the triage vital signs and the nursing notes.  Pertinent labs & imaging results that were available during my care of the patient were reviewed by me and considered in my medical decision making (see chart for details).    Patient is well-appearing.  CT with evidence of acute diverticulitis without complication.  Patient is only been on 2 days of antibiotics.  Do not believe this is been sufficient amount of time to say that she is failed outpatient treatment.  Will Make sure she has medication for symptom control and advised close follow-up with her primary physician.  Return precautions have been given.   Final Clinical Impressions(s) / ED Diagnoses   Final diagnoses:  Diverticulitis  Abdominal cramps  Nausea    ED Discharge Orders         Ordered    ondansetron (ZOFRAN) 4 MG tablet  Every 6 hours     05/10/18 1411    dicyclomine (BENTYL) 20 MG tablet  2 times daily PRN     05/10/18 1411       Loren RacerYelverton, Montoya Watkin, MD 05/10/18 1414

## 2018-05-10 NOTE — ED Triage Notes (Signed)
Nausea and vomiting, abdominal pain, history of diverticulitis

## 2018-05-10 NOTE — Discharge Instructions (Signed)
Continue antibiotics as prescribed.  Make sure you are drinking plenty of fluids.  Follow-up with your primary physician to make sure that your symptoms have resolved.  Return immediately for any worsening of your pain, persistent vomiting or concerns.

## 2018-05-18 ENCOUNTER — Ambulatory Visit (HOSPITAL_COMMUNITY)
Admission: RE | Admit: 2018-05-18 | Discharge: 2018-05-18 | Disposition: A | Discharge status: Home / Self Care | Payer: Medicare Other | Source: Ambulatory Visit | Attending: Family | Admitting: Family

## 2018-05-18 ENCOUNTER — Ambulatory Visit: Payer: BLUE CROSS/BLUE SHIELD | Admitting: Vascular Surgery

## 2018-05-18 DIAGNOSIS — R936 Abnormal findings on diagnostic imaging of limbs: Secondary | ICD-10-CM | POA: Diagnosis not present

## 2018-05-18 DIAGNOSIS — I83893 Varicose veins of bilateral lower extremities with other complications: Secondary | ICD-10-CM | POA: Diagnosis not present

## 2018-05-23 ENCOUNTER — Other Ambulatory Visit: Payer: Self-pay

## 2018-05-23 ENCOUNTER — Encounter: Payer: Self-pay | Admitting: Vascular Surgery

## 2018-05-23 ENCOUNTER — Ambulatory Visit: Payer: Medicare Other | Admitting: Vascular Surgery

## 2018-05-23 VITALS — BP 138/74 | HR 57 | Temp 97.1°F | Resp 16 | Ht 66.0 in | Wt 210.0 lb

## 2018-05-23 DIAGNOSIS — I83813 Varicose veins of bilateral lower extremities with pain: Secondary | ICD-10-CM | POA: Diagnosis not present

## 2018-05-23 NOTE — Progress Notes (Signed)
Patient name: Kelly Rios MRN: [id number] DOB: 1950/12/25 Sex: female  REASON FOR CONSULT: Symptomatic varicose veins with pain bilateral lower extremities  HPI: Kelly Rios is a 67 y.o. female, with a several year history of aching pain and cramps in her legs secondary to varicose veins.  She has been compliant wearing long compression stockings for over a year.  She was originally scheduled for laser ablation of her greater saphenous vein bilaterally but had to cancel the procedure due to some family commitments.  She returns today complaining primarily of pain over the right leg but states that the left leg is not for about hind.  She gets improvement of her pain symptoms with elevation but not complete relief.  She gets irritation of her veins at nighttime when she is sleeping and they rub against each other.  She also gets irritation during the daytime with walking and with her job.  She does describe some nighttime cramps and we had to discuss these probably are not necessarily related to her varicosities but the varicosity irritation may be inciting the cramps.  Other medical problems include diverticulitis for which she is actively being treated with oral antibiotics.  She also has a history of elevated cholesterol hypertension both of which are currently stable.  Past Medical History:  Diagnosis Date  . Anemia   . Diverticulitis   . GERD (gastroesophageal reflux disease)   . High cholesterol   . HTN (hypertension)    Past Surgical History:  Procedure Laterality Date  . ABDOMINAL HYSTERECTOMY    . CHOLECYSTECTOMY    . COLONOSCOPY  08/25/2012   Procedure: COLONOSCOPY;  Surgeon: West Bali, MD;  Location[facility];  Service: Endoscopy;  Laterality: N/A;  12:30-changed to 12:45 Soledad Gerlach notified  . ESOPHAGOGASTRODUODENOSCOPY  08/25/2012   Procedure: ESOPHAGOGASTRODUODENOSCOPY (EGD);  Surgeon: West Bali, MD;  Location: [facility];  Service: Endoscopy;  Laterality:  N/A;  . PARTIAL HYSTERECTOMY      Family History  Problem Relation Age of Onset  . Asthma Unknown   . Diabetes Unknown   . Colon cancer Maternal Grandmother        19s  . Liver disease Neg Hx     SOCIAL HISTORY: Social History   Socioeconomic History  . Marital status: Married    Spouse name: Not on file  . Number of children: 4  . Years of education: Not on file  . Highest education level: Not on file  Occupational History  . Occupation: Chief Technology Officer: [employer]  . Occupation: Camera operator Needs  . Financial resource strain: Not on file  . Food insecurity:    Worry: Not on file    Inability: Not on file  . Transportation needs:    Medical: Not on file    Non-medical: Not on file  Tobacco Use  . Smoking status: Former Smoker    Packs/day: 0.50    Years: 15.00    Pack years: 7.50    Types: Cigarettes  . Smokeless tobacco: Never Used  . Tobacco comment: remote  Substance and Sexual Activity  . Alcohol use: No  . Drug use: No  . Sexual activity: Not on file  Lifestyle  . Physical activity:    Days per week: Not on file    Minutes per session: Not on file  . Stress: Not on file  Relationships  . Social connections:    Talks on phone: Not  on file    Gets together: Not on file    Attends religious service: Not on file    Active member of club or organization: Not on file    Attends meetings of clubs or organizations: Not on file    Relationship status: Not on file  . Intimate partner violence:    Fear of current or ex partner: Not on file    Emotionally abused: Not on file    Physically abused: Not on file    Forced sexual activity: Not on file  Other Topics Concern  . Not on file  Social History Narrative  . Not on file    Allergies  Allergen Reactions  . Codeine Shortness Of Breath    Current Outpatient Medications  Medication Sig Dispense Refill  . bisoprolol-hydrochlorothiazide (ZIAC) 10-6.25 MG per tablet Take 1 tablet by mouth  daily.    . calcium & magnesium carbonates (MYLANTA) 311-232 MG per tablet Take 1 tablet by mouth daily.    . cefUROXime (CEFTIN) 500 MG tablet     . ciprofloxacin (CIPRO) 500 MG tablet Take 1 tablet by mouth 2 (two) times daily.    . cyclobenzaprine (FLEXERIL) 10 MG tablet Take 10 mg by mouth 3 (three) times daily as needed. Muscle spasms.    Marland Kitchen dicyclomine (BENTYL) 20 MG tablet Take 1 tablet (20 mg total) by mouth 2 (two) times daily as needed for spasms. 20 tablet 0  . Homeopathic Products ([employer] LEG CRAMPS PAIN RELIEF PO) Take 2 tablets by mouth every 3 (three) hours as needed. Leg cramps    . ondansetron (ZOFRAN) 4 MG tablet Take 1 tablet (4 mg total) by mouth every 6 (six) hours. 12 tablet 0  . potassium chloride (K-DUR,KLOR-CON) 10 MEQ tablet Take 10 mEq by mouth 2 (two) times daily.    . ferrous sulfate 325 (65 FE) MG tablet Take 325 mg by mouth daily with breakfast.    . metroNIDAZOLE (FLAGYL) 500 MG tablet     . potassium chloride SA (K-DUR,KLOR-CON) 20 MEQ tablet   0  . predniSONE (DELTASONE) 10 MG tablet Take 10 mg by mouth 3 (three) times daily.     No current facility-administered medications for this visit.     ROS:   General:  No weight loss, Fever, chills  HEENT: No recent headaches, no nasal bleeding, no visual changes, no sore throat  Neurologic: No dizziness, blackouts, seizures. No recent symptoms of stroke or mini- stroke. No recent episodes of slurred speech, or temporary blindness.  Cardiac: No recent episodes of chest pain/pressure, no shortness of breath at rest.  + shortness of breath with exertion.  Denies history of atrial fibrillation or irregular heartbeat  Vascular: No history of rest pain in feet.  No history of claudication.  No history of non-healing ulcer, No history of DVT   Pulmonary: No home oxygen, no productive cough, no hemoptysis,  No asthma or wheezing  Musculoskeletal:  [X]  Arthritis, [ ]  Low back pain,  [X]  Joint pain  Hematologic:No history  of hypercoagulable state.  No history of easy bleeding.  No history of anemia  Gastrointestinal: No hematochezia or melena,  No gastroesophageal reflux, no trouble swallowing  Urinary: [ ]  chronic Kidney disease, [ ]  on HD - [ ]  MWF or [ ]  TTHS, [ ]  Burning with urination, [ ]  Frequent urination, [ ]  Difficulty urinating;   Skin: No rashes  Psychological: No history of anxiety,  No history of depression   Physical Examination  Vitals:   05/23/18 1015 05/23/18 1023  BP: (!) 141/77 138/74  Pulse: (!) 56 (!) 57  Resp: 16   Temp: (!) 97.1 F (36.2 C)   TempSrc: Oral   SpO2: 98%   Weight: 210 lb (95.3 kg)   Height: 5\' 6"  (1.676 m)     Body mass index is 33.89 kg/m.  General:  Alert and oriented, no acute distress HEENT: Normal Neck: No bruit or JVD Pulmonary: Clear to auscultation bilaterally Cardiac: Regular Rate and Rhythm without murmur Abdomen: Soft, non-tender, non-distended, no mass Skin: No rash, scattered varicosities primarily the right calf right knee left knee these are all 5 to 7 mm in diameter.  She also has multiple spider and reticular type varicosities bilaterally diffusely around the ankles foot and calf.  She also has a few reticular type varicosities in the thigh.  No open ulceration, she also has brawny hemosiderin staining on the inner aspect of her calf at the gaiter area bilaterally. Extremity Pulses:  2+ radial, brachial, femoral, dorsalis pedis, posterior tibial pulses bilaterally Musculoskeletal: No deformity trace pretibial right leg edema  Neurologic: Upper and lower extremity motor 5/5 and symmetric  DATA:  Patient had a venous reflux exam performed on May 18, 2018.  This showed diffuse reflux throughout the right greater saphenous vein common femoral vein and saphenofemoral junction no evidence of DVT vein diameter on the right was 5 to 8 mm.  Similar findings were on the left vein diameter was 5 to 6 mm.  ASSESSMENT: Symptomatic varicose veins  bilateral lower extremities with pain and skin changes.  She is CEAP class IV.  She has not had relief of symptoms despite being very compliant in wearing her lower extremity compression stockings and elevating her legs.   PLAN: Bilateral lower extremity laser ablation right prior to the left.  We will also do stab avulsions of both legs greater than 20 each leg and possibly some sclerotherapy as well.  We will talk to the patient regarding scheduling after insurance approval.   Fabienne Brunsharles Talib Headley, MD [facility] Office: [phone] Pager: [phone]

## 2018-06-02 ENCOUNTER — Other Ambulatory Visit: Payer: Self-pay | Admitting: *Deleted

## 2018-06-02 DIAGNOSIS — I83813 Varicose veins of bilateral lower extremities with pain: Secondary | ICD-10-CM

## 2018-06-28 ENCOUNTER — Ambulatory Visit: Payer: Medicare Other | Admitting: Vascular Surgery

## 2018-06-28 ENCOUNTER — Encounter: Payer: Self-pay | Admitting: Vascular Surgery

## 2018-06-28 ENCOUNTER — Other Ambulatory Visit: Payer: Self-pay

## 2018-06-28 VITALS — BP 140/73 | HR 68 | Temp 97.0°F | Resp 16 | Ht 66.0 in | Wt 208.0 lb

## 2018-06-28 DIAGNOSIS — I868 Varicose veins of other specified sites: Secondary | ICD-10-CM

## 2018-06-28 DIAGNOSIS — I83811 Varicose veins of right lower extremities with pain: Secondary | ICD-10-CM

## 2018-06-28 HISTORY — PX: ENDOVENOUS ABLATION SAPHENOUS VEIN W/ LASER: SUR449

## 2018-06-28 NOTE — Progress Notes (Signed)
     Laser Ablation Procedure    Date: 06/28/2018   Kelly Rios DOB:1950/10/25  Consent signed: Yes    Surgeon:  Dr. Fabienne Bruns  Procedure: Laser Ablation: right Greater Saphenous Vein  BP 140/73 (BP Location: Right Arm, Patient Position: Sitting, Cuff Size: Large)   Pulse 68   Temp (!) 97 F (36.1 C) (Oral)   Resp 16   Ht 5\' 6"  (1.676 m)   Wt 208 lb (94.3 kg)   SpO2 98%   BMI 33.57 kg/m   Tumescent Anesthesia: 275 cc 0.9% NaCl with 50 cc Lidocaine HCL 1% and 15 cc 8.4% NaHCO3  Local Anesthesia: 7 cc Lidocaine HCL and NaHCO3 (ratio 2:1)  15 watts continuous mode        Total energy: 857.2 Joules   Total time: 0.57    Patient tolerated procedure well    Description of Procedure:  After marking the course of the secondary varicosities, the patient was placed on the operating table in the supine position, and the right leg was prepped and draped in sterile fashion.   Local anesthetic was administered and under ultrasound guidance the saphenous vein was accessed with a micro needle and guide wire; then the mirco puncture sheath was placed.  This Puncture was adjacent to the knee segment of the saphenous vein.  I was able to advance the guidewire about 15 cm.  However at this point I encountered a branch area and could not get the guidewire to traverse this tortuous segment.  At this point additional local anesthesia was infiltrated just above the branch point was because about mid thigh about 40 cm from the saphenofemoral junction.  I was able to cannulate the vein in this location and advance the guidewire more easily.  A guide wire was inserted saphenofemoral junction , followed by a 5 french sheath.  The position of the sheath and then the laser fiber below the junction was confirmed using the ultrasound.  Tumescent anesthesia was administered along the course of the saphenous vein using ultrasound guidance. The patient was placed in Trendelenburg position and protective laser  glasses were placed on patient and staff, and the laser was fired at 15 watts continuous mode advancing 1-45mm/second for a total of 857.2 joules.     Steri strips were applied to the IV insertion site and ABD pads and thigh high compression stockings were applied.  Ace wrap bandages were applied over the right thigh and at the top of the saphenofemoral junction. Blood loss was less than 15 cc.  The patient ambulated out of the operating room having tolerated the procedure well.  Fabienne Bruns, MD [facility] [place] Office: [phone] Pager: [phone]

## 2018-06-28 NOTE — Progress Notes (Signed)
Vitals:   06/28/18 1031  BP: (!) 145/70  Pulse: 68  Resp: 16  Temp: (!) 97 F (36.1 C)  TempSrc: Oral  SpO2: 98%  Weight: 208 lb (94.3 kg)  Height: 5\' 6"  (1.676 m)

## 2018-07-05 ENCOUNTER — Ambulatory Visit (HOSPITAL_COMMUNITY)
Admission: RE | Admit: 2018-07-05 | Discharge: 2018-07-05 | Disposition: A | Discharge status: Home / Self Care | Payer: Medicare Other | Source: Ambulatory Visit | Attending: Vascular Surgery | Admitting: Vascular Surgery

## 2018-07-05 ENCOUNTER — Encounter: Payer: Self-pay | Admitting: Vascular Surgery

## 2018-07-05 ENCOUNTER — Encounter (INDEPENDENT_AMBULATORY_CARE_PROVIDER_SITE_OTHER): Payer: Self-pay

## 2018-07-05 ENCOUNTER — Other Ambulatory Visit: Payer: Self-pay

## 2018-07-05 ENCOUNTER — Ambulatory Visit: Payer: Medicare Other | Admitting: Vascular Surgery

## 2018-07-05 VITALS — BP 148/78 | HR 58 | Temp 97.1°F | Resp 16 | Ht 66.0 in | Wt 210.0 lb

## 2018-07-05 DIAGNOSIS — I83813 Varicose veins of bilateral lower extremities with pain: Secondary | ICD-10-CM | POA: Insufficient documentation

## 2018-07-05 NOTE — Progress Notes (Signed)
Vitals:   07/05/18 1213  BP: (!) 148/77  Pulse: 60  Resp: 16  Temp: (!) 97.1 F (36.2 C)  TempSrc: Oral  SpO2: 98%  Weight: 210 lb (95.3 kg)  Height: 5\' 6"  (1.676 m)

## 2018-07-05 NOTE — Progress Notes (Signed)
Patient is a 67 year old female who is status post laser ablation of the right greater saphenous vein on June 28, 2018.  She states that most the soreness has now resolved.  She has no significant swelling in the right leg.  She has had some resolution on decompression of the veins on the anterior aspect of her right leg.  She still has a large cluster of varicosities on the posterior medial right calf.  The still cause her some pain.  She also still complains of pain and aching in the varicose veins in her left leg.  Data: Patient had a duplex ultrasound today which showed no evidence of DVT greater saphenous vein has been obliterated to within 1.5 cm of the saphenofemoral junction.  Physical exam:  Vitals:   07/05/18 1213 07/05/18 1218  BP: (!) 148/77 (!) 148/78  Pulse: 60 (!) 58  Resp: 16   Temp: (!) 97.1 F (36.2 C)   TempSrc: Oral   SpO2: 98%   Weight: 210 lb (95.3 kg)   Height: 5\' 6"  (1.676 m)     Right lower extremity: Well-healed insertion site still multiple areas of spider type varicosities and a large cluster of varicosities in the right medial posterior calf covering the surface area of about 9 cm and these are 4 to 5 mm diameter    left lower extremity is unchanged brawny skin staining and multiple areas of spider and reticular type varicosities  Assessment: Doing well status post laser ablation right greater saphenous vein.  Plan: We will schedule patient for laser ablation of the left greater saphenous vein in the near future.  After that we may need to revisit whether or not she would need some stab avulsions in the right and left leg to follow-up after laser ablation.  Fabienne Brunsharles Maitri Schnoebelen, MD [facility] [place] Office: [phone] Pager: [phone]

## 2018-07-06 ENCOUNTER — Other Ambulatory Visit: Payer: Self-pay | Admitting: *Deleted

## 2018-07-06 DIAGNOSIS — I83893 Varicose veins of bilateral lower extremities with other complications: Secondary | ICD-10-CM

## 2018-07-19 ENCOUNTER — Encounter: Payer: Self-pay | Admitting: Vascular Surgery

## 2018-07-19 ENCOUNTER — Ambulatory Visit (INDEPENDENT_AMBULATORY_CARE_PROVIDER_SITE_OTHER): Payer: Medicare Other | Admitting: Vascular Surgery

## 2018-07-19 ENCOUNTER — Other Ambulatory Visit: Payer: Self-pay

## 2018-07-19 VITALS — BP 171/73 | HR 67 | Resp 18 | Ht 66.0 in | Wt 207.0 lb

## 2018-07-19 DIAGNOSIS — I83812 Varicose veins of left lower extremities with pain: Secondary | ICD-10-CM

## 2018-07-19 HISTORY — PX: ENDOVENOUS ABLATION SAPHENOUS VEIN W/ LASER: SUR449

## 2018-07-19 NOTE — Progress Notes (Signed)
     Laser Ablation Procedure    Date: 07/19/2018   Kelly Rios DOB:[birth year]  Consent signed: Yes    Surgeon:  Dr. Fabienne Bruns  Procedure: Laser Ablation: left Greater Saphenous Vein  BP (!) 171/73 Comment: recheck  Pulse 67   Resp 18   Ht 5\' 6"  (1.676 m)   Wt 207 lb (93.9 kg)   SpO2 96%   BMI 33.41 kg/m   Tumescent Anesthesia: 425 cc 0.9% NaCl with 50 cc Lidocaine HCL 1% and 15 cc 8.4% NaHCO3  Local Anesthesia: 8 cc Lidocaine HCL and NaHCO3 (ratio 2:1)  15 watts continuous mode        Total energy: 1434 Joules   Total time: 1:34   Patient tolerated procedure well    Description of Procedure:  After marking the course of the secondary varicosities, the patient was placed on the operating table in the supine position, and the left leg was prepped and draped in sterile fashion.   Local anesthetic was administered and under ultrasound guidance the saphenous vein was accessed with a micro needle and guide wire; then the mirco puncture sheath was placed.  A guide wire was inserted saphenofemoral junction , followed by a 5 french sheath.  The position of the sheath and then the laser fiber below the junction was confirmed using the ultrasound.  Tumescent anesthesia was administered along the course of the saphenous vein using ultrasound guidance. The patient was placed in Trendelenburg position and protective laser glasses were placed on patient and staff, and the laser was fired at 15 watts continuous mode advancing 1-77mm/second for a total of 1434 joules.   .  Steri strip were applied to the IV insertion site and ABD pads and thigh high compression stockings were applied.  Ace wrap bandages were applied over the left thigh and at the top of the saphenofemoral junction. Blood loss was less than 15 cc.  The patient ambulated out of the operating room having tolerated the procedure well.

## 2018-07-24 ENCOUNTER — Encounter: Payer: Self-pay | Admitting: Vascular Surgery

## 2018-07-26 ENCOUNTER — Encounter: Payer: Self-pay | Admitting: Vascular Surgery

## 2018-07-26 ENCOUNTER — Ambulatory Visit (HOSPITAL_COMMUNITY)
Admission: RE | Admit: 2018-07-26 | Discharge: 2018-07-26 | Disposition: A | Discharge status: Home / Self Care | Payer: Medicare Other | Source: Ambulatory Visit | Attending: Vascular Surgery | Admitting: Vascular Surgery

## 2018-07-26 ENCOUNTER — Ambulatory Visit (INDEPENDENT_AMBULATORY_CARE_PROVIDER_SITE_OTHER): Payer: Medicare Other | Admitting: Vascular Surgery

## 2018-07-26 VITALS — BP 152/77 | HR 59 | Resp 18 | Ht 66.0 in | Wt 207.0 lb

## 2018-07-26 DIAGNOSIS — I83893 Varicose veins of bilateral lower extremities with other complications: Secondary | ICD-10-CM | POA: Insufficient documentation

## 2018-07-26 DIAGNOSIS — I83813 Varicose veins of bilateral lower extremities with pain: Secondary | ICD-10-CM

## 2018-07-26 NOTE — Progress Notes (Signed)
Patient is a 67 year old female who returns for postoperative follow-up today after recent left greater saphenous laser ablation.  She had previously had the right greater saphenous ablated a few weeks prior.  She reports still some soreness in the right inguinal region and left mid thigh region but overall is improved.  She still complains of varicosities that cross over her left anterior knee and right posterior medial calf.  She is being compliant with her compression stockings.  Physical exam  Vitals:   07/26/18 1030  BP: (!) 152/77  Pulse: (!) 59  Resp: 18  SpO2: 96%  Weight: 207 lb (93.9 kg)  Height: 5\' 6"  (1.676 m)    Extremities: Mild ecchymosis left medial thigh, palpable cord right medial thigh consistent with ablated right greater saphenous no significant tenderness  Skin: 10-20 varicosities across the left knee approximately 5 mm in diameter warm and slightly tender to palpation, 10-20 varicosities in the right posterior medial calf with similar findings to the left see images below      Assessment: Successful laser ablation of right and left greater saphenous veins.  She still has symptomatic varicose veins left knee and right posterior medial calf with pain.  Plan: She will continue to wear her compression stockings for another 3 months.  If she still has persistent symptoms we will work on getting stab avulsions for the knee and the right medial calf approved.  Fabienne Bruns, MD [facility] [place] Office: [phone] Pager: [phone]

## 2018-11-15 ENCOUNTER — Encounter: Payer: Self-pay | Admitting: Vascular Surgery

## 2018-11-15 ENCOUNTER — Ambulatory Visit: Payer: Medicare Other | Admitting: Vascular Surgery

## 2018-11-15 ENCOUNTER — Other Ambulatory Visit: Payer: Self-pay

## 2018-11-15 VITALS — BP 170/80 | HR 65 | Temp 98.0°F | Resp 18 | Ht 66.0 in | Wt 207.0 lb

## 2018-11-15 DIAGNOSIS — I83813 Varicose veins of bilateral lower extremities with pain: Secondary | ICD-10-CM

## 2018-11-15 NOTE — Progress Notes (Signed)
Patient is a 68 year old female who returns for follow-up today.  She previously underwent laser ablation of her right and left greater saphenous veins for symptomatic varicose veins with pain and swelling.  She states that her veins have decompressed slightly but she still has warmth and aching especially over the left knee and somewhat over the right medial calf.  She also has several spider and reticular type veins in both lower extremities.  Review of systems: She denies shortness of breath.  She denies chest pain.   Past Medical History:  Diagnosis Date  . Anemia   . Diverticulitis   . GERD (gastroesophageal reflux disease)   . High cholesterol   . HTN (hypertension)     Past Surgical History:  Procedure Laterality Date  . ABDOMINAL HYSTERECTOMY    . CHOLECYSTECTOMY    . COLONOSCOPY  08/25/2012   Procedure: COLONOSCOPY;  Surgeon: West Bali, MD;  Location: [facility];  Service: Endoscopy;  Laterality: N/A;  12:30-changed to 12:45 Soledad Gerlach notified  . ENDOVENOUS ABLATION SAPHENOUS VEIN W/ LASER Right 06/28/2018   endovenous laser ablation R GSV by Fabienne Bruns MD  . ENDOVENOUS ABLATION SAPHENOUS VEIN W/ LASER Left 07/19/2018   endovenous laser ablation Left greater saphenous vein by Fabienne Bruns MD   . ESOPHAGOGASTRODUODENOSCOPY  08/25/2012   Procedure: ESOPHAGOGASTRODUODENOSCOPY (EGD);  Surgeon: West Bali, MD;  Location: [facility];  Service: Endoscopy;  Laterality: N/A;  . PARTIAL HYSTERECTOMY      Current Outpatient Medications on File Prior to Visit  Medication Sig Dispense Refill  . bisoprolol-hydrochlorothiazide (ZIAC) 10-6.25 MG per tablet Take 1 tablet by mouth daily.    . calcium & magnesium carbonates (MYLANTA) 311-232 MG per tablet Take 1 tablet by mouth daily.    . cefUROXime (CEFTIN) 500 MG tablet     . ciprofloxacin (CIPRO) 500 MG tablet Take 1 tablet by mouth 2 (two) times daily.    . cyclobenzaprine (FLEXERIL) 10 MG tablet Take 10 mg by mouth 3  (three) times daily as needed. Muscle spasms.    Marland Kitchen dicyclomine (BENTYL) 20 MG tablet Take 1 tablet (20 mg total) by mouth 2 (two) times daily as needed for spasms. 20 tablet 0  . ferrous sulfate 325 (65 FE) MG tablet Take 325 mg by mouth daily with breakfast.    . Homeopathic Products ([employer] LEG CRAMPS PAIN RELIEF PO) Take 2 tablets by mouth every 3 (three) hours as needed. Leg cramps    . metroNIDAZOLE (FLAGYL) 500 MG tablet     . ondansetron (ZOFRAN) 4 MG tablet Take 1 tablet (4 mg total) by mouth every 6 (six) hours. 12 tablet 0  . potassium chloride (K-DUR,KLOR-CON) 10 MEQ tablet Take 10 mEq by mouth 2 (two) times daily.    . potassium chloride SA (K-DUR,KLOR-CON) 20 MEQ tablet   0  . predniSONE (DELTASONE) 10 MG tablet Take 10 mg by mouth 3 (three) times daily.     No current facility-administered medications on file prior to visit.     Physical exam:  Vitals:   11/15/18 1544  BP: (!) 170/80  Pulse: 65  Resp: 18  Temp: 98 F (36.7 C)  TempSrc: Oral  SpO2: 95%  Weight: 207 lb (93.9 kg)  Height: 5\' 6"  (1.676 m)    Extremities: 2+ dorsalis pedis pulses  Skin: Scattered diffuse spider type varicosities ankle and calf region bilaterally, cluster of varicosities over the left patellar region which is warm 5 to 6 mm diameter, similar  findings right posterior medial calf            Assessment: Symptomatic varicose veins with pain warmth swelling bilateral lower extremities despite previous laser ablation of the greater saphenous vein.  She also has scattered spider and reticular type varicosities.  Plan: Stab avulsions left knee area and right posterior medial calf area as well as sclerotherapy injection of reticular and spider varicosities bilateral lower extremities.  We will schedule this pending insurance approval.  Fabienne Brunsharles , MD [facility] [place] Office: [phone] Pager: [phone]

## 2019-01-17 ENCOUNTER — Encounter: Payer: Medicare Other | Admitting: Vascular Surgery

## 2019-02-07 ENCOUNTER — Encounter: Payer: Medicare Other | Admitting: Vascular Surgery

## 2019-04-25 ENCOUNTER — Ambulatory Visit (INDEPENDENT_AMBULATORY_CARE_PROVIDER_SITE_OTHER): Payer: Medicare Other | Admitting: Vascular Surgery

## 2019-04-25 ENCOUNTER — Other Ambulatory Visit: Payer: Self-pay

## 2019-04-25 ENCOUNTER — Encounter: Payer: Self-pay | Admitting: Vascular Surgery

## 2019-04-25 VITALS — BP 152/79 | HR 80 | Temp 98.2°F | Ht 66.0 in | Wt 207.0 lb

## 2019-04-25 DIAGNOSIS — I83812 Varicose veins of left lower extremities with pain: Secondary | ICD-10-CM

## 2019-04-25 HISTORY — PX: OTHER SURGICAL HISTORY: SHX169

## 2019-04-25 NOTE — Progress Notes (Signed)
    Stab Phlebectomy Procedure  [name] DOB:06/02/1951  04/25/2019  Consent signed: Yes  Surgeon:C.E. Fields, MD  Procedure: stab phlebectomy: left leg  BP (!) 152/79 (BP Location: Left Arm, Patient Position: Bed low/side rails up, Cuff Size: Large)   Pulse 80   Temp 98.2 F (36.8 C) (Other (Comment)) Comment (Src): forehead  Ht 5\' 6"  (1.676 m)   Wt 207 lb (93.9 kg)   BMI 33.41 kg/m   Start time: 11:00 AM   End time: 12:00 PM    Tumescent Anesthesia: 200 cc 0.9% NaCl with 50 cc Lidocaine HCL 1 % and 15 cc 8.4% NaHCO3  Local Anesthesia: 4 cc Lidocaine HCL and NaHCO3 (ratio 2:1)    Stab Phlebectomy: 10-20 Sites: Thigh and Calf  Patient tolerated procedure well: Yes  Notes: [name] wore facial mask. All staff members wore facial masks and facial shields.    Description of Procedure:  After marking the course of the secondary varicosities, the patient was placed on the operating table in the supine position, and the left leg was prepped and draped in sterile fashion.    The patient was then put into Trendelenburg position.  Local anesthetic was administered at the previously marked varicosities, and tumescent anesthesia was administered around the vessels.  Ten to 20 stab wounds were made using the tip of an 11 blade. And using the vein hook, the phlebectomies were performed using a hemostat to avulse the varicosities.  Adequate hemostasis was achieved, and steri strips were applied to the stab wound.      ABD pads and thigh high compression stockings were applied as well ace wraps where needed. Blood loss was less than 15 cc.  The patient ambulated out of the operating room having tolerated the procedure well.  Ruta Hinds, MD [facility] [place] Office: [phone] Pager: [phone]

## 2019-05-22 ENCOUNTER — Telehealth (HOSPITAL_COMMUNITY): Payer: Self-pay | Admitting: Rehabilitation

## 2019-05-22 NOTE — Telephone Encounter (Signed)

## 2019-05-23 ENCOUNTER — Ambulatory Visit: Payer: Medicare Other | Admitting: Vascular Surgery

## 2019-05-23 ENCOUNTER — Encounter: Payer: Self-pay | Admitting: Vascular Surgery

## 2019-05-23 ENCOUNTER — Other Ambulatory Visit: Payer: Self-pay

## 2019-05-23 VITALS — BP 163/74 | HR 63 | Temp 98.6°F | Resp 16 | Ht 66.0 in | Wt 207.0 lb

## 2019-05-23 DIAGNOSIS — I83813 Varicose veins of bilateral lower extremities with pain: Secondary | ICD-10-CM | POA: Diagnosis not present

## 2019-05-23 DIAGNOSIS — I83811 Varicose veins of right lower extremities with pain: Secondary | ICD-10-CM

## 2019-05-23 HISTORY — PX: OTHER SURGICAL HISTORY: SHX169

## 2019-05-23 NOTE — Progress Notes (Signed)
    Stab Phlebectomy Procedure  Kelly Rios DOB:08/10/1951  05/23/2019  Consent signed: Yes  Surgeon:C.E. Gloriann Riede, MD  Procedure: stab phlebectomy: right leg  BP (!) 163/74 (BP Location: Left Arm, Patient Position: Sitting, Cuff Size: Large)   Pulse 63   Temp 98.6 F (37 C) (Temporal)   Resp 16   Ht 5\' 6"  (1.676 m)   Wt 207 lb (93.9 kg)   SpO2 97%   BMI 33.41 kg/m   Start time: 11:20 AM   End time: 12:20 PM    Tumescent Anesthesia: 200 cc 0.9% NaCl with 50 cc Lidocaine HCL 1 % and 15 cc 8.4% NaHCO3  Local Anesthesia: 10 cc Lidocaine HCL and NaHCO3 (ratio 2:1)   Stab Phlebectomy: 10-20 Sites: Calf and Ankle  Patient tolerated procedure well: Yes  Notes: Kelly Rios wore facial mask. All staff members wore facial masks and facial shields.  Kelly Rios experienced heavy bleeding from right calf stab phlebectomy incision when ambulating to bathroom. Assisted patient to lying position, elevated leg above level of heart, and compressed right calf varicosity until bleeding stopped. Reapplied steri strips and applied new compression dressing to right leg. Escorted patient to car with no further bleeding episode.    Description of Procedure:  After marking the course of the secondary varicosities, the patient was placed on the operating table in the supine position, and the right leg was prepped and draped in sterile fashion.    The patient was then put into Trendelenburg position.  Local anesthetic was administered at the previously marked varicosities, and tumescent anesthesia was administered around the vessels.  Ten to 20 stab wounds were made using the tip of an 11 blade. And using the vein hook, the phlebectomies were performed using a hemostat to avulse the varicosities.  Adequate hemostasis was achieved, and steri strips were applied to the stab wound.      ABD pads and thigh high compression stockings were applied as well ace wraps where needed. Blood loss was less than 15 cc.   The patient ambulated out of the operating room having tolerated the procedure well.  Ruta Hinds, MD [facility] [place] Office: [phone] Pager: [phone]

## 2019-06-20 ENCOUNTER — Ambulatory Visit: Payer: Medicare Other | Admitting: Vascular Surgery

## 2019-06-20 ENCOUNTER — Other Ambulatory Visit: Payer: Self-pay

## 2019-06-20 ENCOUNTER — Ambulatory Visit: Payer: Self-pay | Admitting: Vascular Surgery

## 2019-06-20 ENCOUNTER — Encounter: Payer: Self-pay | Admitting: Vascular Surgery

## 2019-06-20 VITALS — BP 148/81 | HR 70 | Temp 98.2°F | Resp 18 | Ht 66.0 in | Wt 212.2 lb

## 2019-06-20 DIAGNOSIS — I83811 Varicose veins of right lower extremities with pain: Secondary | ICD-10-CM

## 2019-06-20 NOTE — Progress Notes (Signed)
Patient is a 68 year old female who returns for postoperative follow-up today.  She underwent stab avulsions on the right leg May 23, 2019.  Recently she was working and going up and down several flights of stairs.  She began to have a pop in her right knee and has had some pain.  She thinks there is some fluid on her right knee at this point.  She really has no complaints of the stab avulsion sites.  She is continuing to wear her compression stockings.  Physical exam:  Vitals:   06/20/19 1356  BP: (!) 148/81  Pulse: 70  Resp: 18  Temp: 98.2 F (36.8 C)  TempSrc: Temporal  SpO2: 98%  Weight: 212 lb 3.2 oz (96.3 kg)  Height: 5\' 6"  (1.676 m)    Stab avulsion sites right leg healing well no erythema no drainage  Right knee no obvious fluctuance mildly tender to palpation  Assessment: Doing well status post stab avulsions right leg.  Plan: Patient is scheduled for sclerotherapy of both lower extremities in the near future.  As far as her knee is concerned she has an orthopedic doctor that follows her for this and she will follow-up with them on as-needed basis.  Ruta Hinds, MD [facility] [place] [facility] Office: [phone] Pager: [phone]

## 2019-06-26 ENCOUNTER — Ambulatory Visit: Payer: Medicare Other

## 2019-06-26 ENCOUNTER — Other Ambulatory Visit: Payer: Self-pay

## 2019-06-26 DIAGNOSIS — I8390 Asymptomatic varicose veins of unspecified lower extremity: Secondary | ICD-10-CM

## 2019-06-26 NOTE — Progress Notes (Signed)
Pt. Received 2 mL of 1% Asclera administered with a 27g butterfly. Only treated left leg (front and back), as pt did not want right leg treated today due to an injury/swelling from a couple of weeks ago. She is scheduled to return at the end of the month to treat right leg. Pt tolerated well. Anticipate good results. Provided pt with post procedure instructions both verbally and on hand out.   Photos: Yes.    Compression stockings applied: Yes.

## 2019-07-16 ENCOUNTER — Ambulatory Visit: Payer: Medicare Other

## 2019-07-16 ENCOUNTER — Other Ambulatory Visit: Payer: Self-pay

## 2019-07-16 DIAGNOSIS — I8393 Asymptomatic varicose veins of bilateral lower extremities: Secondary | ICD-10-CM

## 2019-07-16 NOTE — Progress Notes (Signed)
Treated patient's small reticular and spider veins with 55mL of 1% Asclera administered with a 27g butterfly. Treated right lower leg/ankle to foot area and one group of veins on medial right leg. Also, treated one area on left leg (outer aspect). Pt. Tolerated well. Already seeing good results from previous treatment. Pt given post procedure instructions both verbally and on handout. Will follow PRN.   Photos: Yes.    Compression stockings applied: Yes.

## 2019-07-17 ENCOUNTER — Encounter: Payer: Self-pay | Admitting: Vascular Surgery

## 2019-08-31 ENCOUNTER — Other Ambulatory Visit: Payer: Self-pay

## 2019-08-31 ENCOUNTER — Ambulatory Visit: Payer: Medicare Other

## 2019-08-31 ENCOUNTER — Encounter: Payer: Self-pay | Admitting: Orthopedic Surgery

## 2019-08-31 ENCOUNTER — Ambulatory Visit: Payer: Medicare Other | Admitting: Orthopedic Surgery

## 2019-08-31 VITALS — BP 140/69 | HR 66 | Temp 97.1°F

## 2019-08-31 DIAGNOSIS — M25561 Pain in right knee: Secondary | ICD-10-CM | POA: Diagnosis not present

## 2019-08-31 DIAGNOSIS — G8929 Other chronic pain: Secondary | ICD-10-CM

## 2019-08-31 DIAGNOSIS — M25461 Effusion, right knee: Secondary | ICD-10-CM | POA: Diagnosis not present

## 2019-08-31 NOTE — Patient Instructions (Signed)
Continue ice 3 x a day   Keep using the ace wrap

## 2019-08-31 NOTE — Progress Notes (Signed)
Chief Complaint  Patient presents with  . 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  She saw the primary care doctor who recommended some prednisone but the side effects scared her  Review of Systems  Musculoskeletal: Positive for joint pain.       Left knee pain  All other systems reviewed and are negative.   Past Medical History:  Diagnosis Date  . Anemia   . Diverticulitis   . GERD (gastroesophageal reflux disease)   . High cholesterol   . HTN (hypertension)     BP 140/69   Pulse 66   Temp (!) 97.1 F (36.2 C)   Physical Exam Vitals signs and nursing note reviewed.  Constitutional:      Appearance: Normal appearance.  Musculoskeletal:     Comments: She is definitely limping favoring that right knee  The left knee shows no tenderness full range of motion is recorded it stable muscle tone is excellent and the skin is intact except for some varicose veins but there are no ulcerations  The right knee and leg again show swelling with varicose veins but the skin is intact there is no erythema the joint shows no warmth it is tender sort of diffusely without specificity of the joint line Range of motion is 0-85 Ligaments are stable in terms of collateral ligaments and cruciate ligaments Muscle tone is normal  Large joint effusion  Neurological:     Mental Status: She is alert and oriented to person, place, and time.  Psychiatric:        Mood and Affect: Mood normal.    Encounter Diagnoses  Name Primary?  . Chronic pain of right knee Yes  . Effusion, right knee     I recommend aspiration injection Ace wrap and ice with a return 4 to 6  weeks  Procedure note injection and aspiration right knee joint  Verbal consent was obtained to aspirate and inject the right knee joint   Timeout was completed to confirm the site of aspiration and injection  An 18-gauge needle was used to aspirate the knee joint from a suprapatellar lateral approach.  The medications used were 40 mg of Depo-Medrol and 1% lidocaine 3 cc  Anesthesia was provided by ethyl chloride and the skin was prepped with alcohol.  After cleaning the skin with alcohol an 18-gauge needle was used to aspirate the right knee joint.  We obtained 100  cc of fluid the fluid was clear and yellow  We follow this by injection of 40 mg of Depo-Medrol and 3 cc 1% lidocaine.  There were no complications. A sterile bandage was applied.   Radiology report  X-ray right knee 3 views including AP left knee  Axial view of the right knee shows the patella is still centered with no significant tilt or degenerative changes  The standing AP of the right and left knee show that the left knee is in varus with a small joint effusion there is a small amount of joint space narrowing noted as well  On the right side we have 3 to 4  degrees of valgus with possible small joint effusion, peaking of the tibial spine very small cyst at the margin of the tibial plateau on the medial joint margin, with normal lateral view  Impression minimal arthritis in normal alignment on the right mild arthritis with mild varus on the left

## 2019-09-18 ENCOUNTER — Encounter: Payer: Self-pay | Admitting: Vascular Surgery

## 2019-10-01 ENCOUNTER — Other Ambulatory Visit: Payer: Self-pay | Admitting: Orthopedic Surgery

## 2019-10-01 ENCOUNTER — Ambulatory Visit: Payer: Medicare Other | Admitting: Orthopedic Surgery

## 2019-10-01 ENCOUNTER — Telehealth: Payer: Self-pay | Admitting: Radiology

## 2019-10-01 ENCOUNTER — Other Ambulatory Visit: Payer: Self-pay

## 2019-10-01 VITALS — BP 173/89 | HR 67 | Temp 97.1°F | Ht 66.0 in | Wt 207.0 lb

## 2019-10-01 DIAGNOSIS — G8929 Other chronic pain: Secondary | ICD-10-CM | POA: Diagnosis not present

## 2019-10-01 DIAGNOSIS — M25561 Pain in right knee: Secondary | ICD-10-CM

## 2019-10-01 DIAGNOSIS — M23321 Other meniscus derangements, posterior horn of medial meniscus, right knee: Secondary | ICD-10-CM

## 2019-10-01 DIAGNOSIS — M25461 Effusion, right knee: Secondary | ICD-10-CM | POA: Diagnosis not present

## 2019-10-01 MED ORDER — MELOXICAM 7.5 MG PO TABS: 7.5000 mg | ORAL_TABLET | Freq: Every day | ORAL | 5 refills | Status: DC

## 2019-10-01 NOTE — Telephone Encounter (Signed)
Patient states she would like NSAID sent in if you would  [place]

## 2019-10-01 NOTE — Patient Instructions (Signed)
You have been scheduled for an MRI scan  We will call your insurance company to do a precertification to get the MRI covered  You will receive a phone call regarding the date of the scan  Dr Kendle Turbin will call you with the results   

## 2019-10-01 NOTE — Progress Notes (Signed)
Patient ID: KEVA DARTY, female   DOB: May 23, 1951, 68 y.o.   MRN: [id number]   FOLLOW UP VISIT    Chief Complaint  Patient presents with  . Knee Pain    recheck on right knee.    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  We saw her in November at that time her range of motion was limited to 0 to 85 degrees she was limping and favoring her right knee we aspirated about 100 cc of clear yellow fluid and injected the knee  Her x-ray showed minimal arthritis normal alignment  She comes in now with a recurrent popping episode of the knee increased pain decreased swelling despite the treatments as stated     Review of Systems  Constitutional: Negative for fever.  Musculoskeletal: Negative for back pain.  Skin: Negative.   Neurological: Negative for tingling.      Ortho Exam This time we find the right knee has no erythema is normal temperature to touch she does have a medium to large joint effusion her knee feels tight when flexing it up to 110 degrees she has full extension but painful extension the collateral ligaments and cruciate ligaments feel stable the muscle tone and strength are normal and she has good pulse and perfusion in the joint no sensory deficits are seen in the right leg  A/P  Medical decision-making  Encounter Diagnoses  Name Primary?  . Chronic pain of right knee Yes  . 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 Effusion of right knee      Meds ordered this encounter  Medications  . meloxicam (MOBIC) 7.5 MG tablet    Sig: Take 1 tablet (7.5 mg total) by mouth daily.    Dispense:  30 tablet    Refill:  5     MRI right knee  Meds ordered this encounter  Medications  . meloxicam (MOBIC) 7.5 MG tablet    Sig: Take 1 tablet (7.5 mg total) by mouth daily.    Dispense:  30 tablet   Refill:  5        Arther Abbott, MD 10/01/2019 5:08 PM

## 2019-10-10 ENCOUNTER — Ambulatory Visit (HOSPITAL_COMMUNITY)
Admission: RE | Admit: 2019-10-10 | Discharge: 2019-10-10 | Disposition: A | Discharge status: Home / Self Care | Payer: Medicare Other | Source: Ambulatory Visit | Attending: Orthopedic Surgery | Admitting: Orthopedic Surgery

## 2019-10-10 ENCOUNTER — Other Ambulatory Visit: Payer: Self-pay

## 2019-10-10 DIAGNOSIS — G8929 Other chronic pain: Secondary | ICD-10-CM

## 2019-10-10 DIAGNOSIS — M25561 Pain in right knee: Secondary | ICD-10-CM | POA: Insufficient documentation

## 2019-10-18 ENCOUNTER — Telehealth: Payer: Self-pay | Admitting: Orthopedic Surgery

## 2019-10-18 NOTE — Telephone Encounter (Signed)
IMPRESSION: 1. Complex and extensive tear involving the medial meniscus as detailed above. 2. Intact ligamentous structures and no acute bony findings. 3. 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 4. Large joint effusion and mild synovitis.   Electronically Signed   By: Marijo Sanes M.D.   On: 10/11/2019 09:46  The patient's results have been delivered and she is minimally symptomatic at present and would like to hold off on any surgery until things get worse

## 2019-11-21 ENCOUNTER — Telehealth: Payer: Self-pay | Admitting: Orthopedic Surgery

## 2019-11-21 NOTE — Telephone Encounter (Signed)
Patient called to ask if there are any other recommendations for her right knee; states she uses Rapid Freeze; also ices as needed. States she has concerns about staying on Meloxicam or any oral medications, due to some of the side effects noted, mainly related to her breathing.  Please advise.  (I also offered appointment)

## 2019-11-22 NOTE — Telephone Encounter (Signed)
Certainly happy to see her again.  Standard treatment for her condition is anti-inflammatory medications if she is really concerned about that she can stop the meloxicam and take Tylenol 500 mg every 6 hours and of course with her torn meniscus and arthritis arthroscopic surgery would be an option or complete total knee replacement

## 2019-11-22 NOTE — Telephone Encounter (Signed)
Discussed with her, and advised, she states she flushed the Meloxicam, does not want to use it, and she will use Tylenol if she needs it.

## 2020-04-19 ENCOUNTER — Ambulatory Visit
Admission: EM | Admit: 2020-04-19 | Discharge: 2020-04-19 | Disposition: A | Discharge status: Home / Self Care | Payer: Medicare Other | Attending: Emergency Medicine | Admitting: Emergency Medicine

## 2020-04-19 ENCOUNTER — Ambulatory Visit (INDEPENDENT_AMBULATORY_CARE_PROVIDER_SITE_OTHER): Payer: Medicare Other

## 2020-04-19 DIAGNOSIS — K5900 Constipation, unspecified: Secondary | ICD-10-CM | POA: Diagnosis not present

## 2020-04-19 DIAGNOSIS — R1030 Lower abdominal pain, unspecified: Secondary | ICD-10-CM

## 2020-04-19 LAB — POCT URINALYSIS DIP (MANUAL ENTRY)
Bilirubin, UA: NEGATIVE
Blood, UA: NEGATIVE
Glucose, UA: NEGATIVE mg/dL
Leukocytes, UA: NEGATIVE
Nitrite, UA: NEGATIVE
Protein Ur, POC: NEGATIVE mg/dL
Spec Grav, UA: 1.02 (ref 1.010–1.025)
Urobilinogen, UA: 0.2 E.U./dL
pH, UA: 6.5 (ref 5.0–8.0)

## 2020-04-19 NOTE — ED Provider Notes (Addendum)
[facility]   [id number] 04/19/20 Arrival Time: 1028   Chief Complaint  Patient presents with  . Constipation     SUBJECTIVE: History from: patient.  Kelly Rios is a 69 y.o. female with history of diverticulitis presented to the urgent care with a complaint of lower abdominal pain that started yesterday.  Reports she is currently taking Cipro and metronidazole for acute diverticulitis.  She was recently prescribed iron and she has a concern about constipation and bowel obstruction.  Denies similar symptoms in the past.  Denies any alleviating or aggravating factors.  Denies chills, fever, nausea, vomiting, diarrhea.  ROS: As per HPI.  All other pertinent ROS negative.     Past Medical History:  Diagnosis Date  . Anemia   . Diverticulitis   . GERD (gastroesophageal reflux disease)   . High cholesterol   . HTN (hypertension)    Past Surgical History:  Procedure Laterality Date  . ABDOMINAL HYSTERECTOMY    . CHOLECYSTECTOMY    . COLONOSCOPY  08/25/2012   Procedure: COLONOSCOPY;  Surgeon: West Bali, MD;  Location: [facility];  Service: Endoscopy;  Laterality: N/A;  12:30-changed to 12:45 Soledad Gerlach notified  . ENDOVENOUS ABLATION SAPHENOUS VEIN W/ LASER Right 06/28/2018   endovenous laser ablation R GSV by Fabienne Bruns MD  . ENDOVENOUS ABLATION SAPHENOUS VEIN W/ LASER Left 07/19/2018   endovenous laser ablation Left greater saphenous vein by Fabienne Bruns MD   . ESOPHAGOGASTRODUODENOSCOPY  08/25/2012   Procedure: ESOPHAGOGASTRODUODENOSCOPY (EGD);  Surgeon: West Bali, MD;  Location: [facility];  Service: Endoscopy;  Laterality: N/A;  . PARTIAL HYSTERECTOMY    . stab phlebectomy Left 04/25/2019   stab phlebectomy 10-20 incisions left leg by Fabienne Bruns MD   . stab phlebectomy Right 05/23/2019   stab phlebectomy 10-20 incisions right leg by Fabienne Bruns MD    Allergies  Allergen Reactions  . Codeine Shortness Of Breath   No current  facility-administered medications on file prior to encounter.   Current Outpatient Medications on File Prior to Encounter  Medication Sig Dispense Refill  . aspirin 81 MG chewable tablet Chew 81 mg by mouth daily.    Marland Kitchen atorvastatin (LIPITOR) 20 MG tablet     . bisoprolol-hydrochlorothiazide (ZIAC) 10-6.25 MG per tablet Take 1 tablet by mouth daily.    . calcium & magnesium carbonates (MYLANTA) 311-232 MG per tablet Take 1 tablet by mouth daily.    . cefUROXime (CEFTIN) 500 MG tablet     . ciprofloxacin (CIPRO) 500 MG tablet Take 1 tablet by mouth 2 (two) times daily.    . cyclobenzaprine (FLEXERIL) 10 MG tablet Take 10 mg by mouth 3 (three) times daily as needed. Muscle spasms.    Marland Kitchen dicyclomine (BENTYL) 20 MG tablet Take 1 tablet (20 mg total) by mouth 2 (two) times daily as needed for spasms. 20 tablet 0  . ferrous sulfate 325 (65 FE) MG tablet Take 325 mg by mouth daily with breakfast.    . Homeopathic Products ([employer] LEG CRAMPS PAIN RELIEF PO) Take 2 tablets by mouth every 3 (three) hours as needed. Leg cramps    . losartan (COZAAR) 50 MG tablet Take 50 mg by mouth daily.    . metFORMIN (GLUCOPHAGE-XR) 500 MG 24 hr tablet     . metroNIDAZOLE (FLAGYL) 500 MG tablet     . ondansetron (ZOFRAN) 4 MG tablet Take 1 tablet (4 mg total) by mouth every 6 (six) hours. 12 tablet 0  .  potassium chloride (K-DUR,KLOR-CON) 10 MEQ tablet Take 10 mEq by mouth 2 (two) times daily.    . potassium chloride SA (K-DUR,KLOR-CON) 20 MEQ tablet   0  . predniSONE (DELTASONE) 10 MG tablet Take 10 mg by mouth 3 (three) times daily.     Social History   Socioeconomic History  . Marital status: Married    Spouse name: Not on file  . Number of children: 4  . Years of education: Not on file  . Highest education level: Not on file  Occupational History  . Occupation: Chief Technology Officer: [employer]  . Occupation: picker  Tobacco Use  . Smoking status: Former Smoker    Packs/day: 0.50    Years: 15.00    Pack  years: 7.50    Types: Cigarettes  . Smokeless tobacco: Never Used  . Tobacco comment: remote  Substance and Sexual Activity  . Alcohol use: No  . Drug use: No  . Sexual activity: Not on file  Other Topics Concern  . Not on file  Social History Narrative  . Not on file   Social Determinants of Health   Financial Resource Strain:   . Difficulty of Paying Living Expenses:   Food Insecurity:   . Worried About Programme researcher, broadcasting/film/video in the Last Year:   . Barista in the Last Year:   Transportation Needs:   . Freight forwarder (Medical):   Marland Kitchen Lack of Transportation (Non-Medical):   Physical Activity:   . Days of Exercise per Week:   . Minutes of Exercise per Session:   Stress:   . Feeling of Stress :   Social Connections:   . Frequency of Communication with Friends and Family:   . Frequency of Social Gatherings with Friends and Family:   . Attends Religious Services:   . Active Member of Clubs or Organizations:   . Attends Banker Meetings:   Marland Kitchen Marital Status:   Intimate Partner Violence:   . Fear of Current or Ex-Partner:   . Emotionally Abused:   Marland Kitchen Physically Abused:   . Sexually Abused:    Family History  Problem Relation Age of Onset  . Asthma Other   . Diabetes Other   . Colon cancer Maternal Grandmother        1s  . Liver disease Neg Hx     OBJECTIVE:  Vitals:   04/19/20 1043  BP: (!) 154/84  Pulse: 70  Resp: 20  Temp: 98.6 F (37 C)  SpO2: 95%     Physical Exam Vitals and nursing note reviewed.  Constitutional:      General: She is not in acute distress.    Appearance: Normal appearance. She is normal weight. She is not ill-appearing, toxic-appearing or diaphoretic.  Cardiovascular:     Rate and Rhythm: Normal rate and regular rhythm.     Pulses: Normal pulses.     Heart sounds: Normal heart sounds. No murmur heard.  No gallop.   Pulmonary:     Effort: Pulmonary effort is normal. No respiratory distress.     Breath  sounds: Normal breath sounds. No stridor. No wheezing, rhonchi or rales.  Chest:     Chest wall: No tenderness.  Abdominal:     General: Abdomen is flat. There is distension.     Palpations: There is no mass.     Tenderness: There is abdominal tenderness in the right lower quadrant and left lower quadrant. There is no right  CVA tenderness, left CVA tenderness, guarding or rebound. Negative signs include Murphy's sign and McBurney's sign.     Hernia: No hernia is present.  Neurological:     Mental Status: She is alert.     LABS:  Results for orders placed or performed during the hospital encounter of 04/19/20 (from the past 24 hour(s))  POCT urinalysis dipstick     Status: Abnormal   Collection Time: 04/19/20 11:27 AM  Result Value Ref Range   Color, UA yellow yellow   Clarity, UA clear clear   Glucose, UA negative negative mg/dL   Bilirubin, UA negative negative   Ketones, POC UA trace (5) (A) negative mg/dL   Spec Grav, UA 3.614 4.315 - 1.025   Blood, UA negative negative   pH, UA 6.5 5.0 - 8.0   Protein Ur, POC negative negative mg/dL   Urobilinogen, UA 0.2 0.2 or 1.0 E.U./dL   Nitrite, UA Negative Negative   Leukocytes, UA Negative Negative     ASSESSMENT & PLAN:  1. Lower abdominal pain of unknown etiology     No orders of the defined types were placed in this encounter.  Abdomen x-ray is negative for for bowel obstruction.  I have reviewed the x-ray myself and the radiologist interpretation.  I am in agreement with the radiologist interpretation.  Patient is stable at discharge.  Was advised to go to ED for further evaluation with a CT to rule out other abdominal disease process.  Urine analysis was completed and was inconclusive for UTI.  Discharge instructions Patient was advised to go to ED for further evaluation  Reviewed expectations re: course of current medical issues. Questions answered. Outlined signs and symptoms indicating need for more acute  intervention. Patient verbalized understanding. After Visit Summary given.      Note: This document was prepared using Dragon voice recognition software and may include unintentional dictation errors.      Durward Parcel, FNP 04/19/20 1150    Durward Parcel, FNP 04/19/20 1152

## 2020-04-19 NOTE — Discharge Instructions (Addendum)
Go to ED for further evaluation.

## 2020-04-19 NOTE — ED Triage Notes (Signed)
Pt began having lower abdominal pain yesterday, recently prescribed iron and has become constipated. Concerned for obstruction

## 2020-04-20 LAB — URINE CULTURE: Culture: NO GROWTH

## 2020-06-18 DIAGNOSIS — U071 COVID-19: Secondary | ICD-10-CM

## 2020-06-18 HISTORY — DX: COVID-19: U07.1

## 2020-06-27 ENCOUNTER — Ambulatory Visit
Admission: EM | Admit: 2020-06-27 | Discharge: 2020-06-27 | Disposition: A | Discharge status: Home / Self Care | Payer: Medicare Other | Attending: Emergency Medicine | Admitting: Emergency Medicine

## 2020-06-27 ENCOUNTER — Other Ambulatory Visit: Payer: Self-pay

## 2020-06-27 DIAGNOSIS — Z20822 Contact with and (suspected) exposure to covid-19: Secondary | ICD-10-CM

## 2020-06-27 NOTE — ED Triage Notes (Signed)
covid test for exposure  

## 2020-06-30 LAB — NOVEL CORONAVIRUS, NAA: SARS-CoV-2, NAA: DETECTED — AB

## 2020-07-01 ENCOUNTER — Telehealth: Payer: Self-pay | Admitting: Nurse Practitioner

## 2020-07-01 ENCOUNTER — Encounter: Payer: Self-pay | Admitting: Nurse Practitioner

## 2020-07-01 NOTE — Telephone Encounter (Signed)
Called patient to discuss Covid symptoms and the use of casirivimab/imdevimab, a monoclonal antibody infusion for those with mild to moderate Covid symptoms and at a high risk of hospitalization.  Pt is qualified for this infusion at the [facility] center due to; Specific high risk criteria : Older age (>/= 68 yo); HTN; BMI   Message left to call back our hotline [phone].  Nicolasa Ducking, NP

## 2021-03-13 ENCOUNTER — Ambulatory Visit
Admission: EM | Admit: 2021-03-13 | Discharge: 2021-03-13 | Disposition: A | Discharge status: Home / Self Care | Payer: Medicare Other | Attending: Emergency Medicine | Admitting: Emergency Medicine

## 2021-03-13 ENCOUNTER — Other Ambulatory Visit: Payer: Self-pay

## 2021-03-13 DIAGNOSIS — Z20822 Contact with and (suspected) exposure to covid-19: Secondary | ICD-10-CM | POA: Diagnosis not present

## 2021-03-13 NOTE — ED Triage Notes (Signed)
Needs covid test for exposure  

## 2021-03-14 LAB — NOVEL CORONAVIRUS, NAA: SARS-CoV-2, NAA: NOT DETECTED

## 2021-03-14 LAB — SARS-COV-2, NAA 2 DAY TAT

## 2021-06-15 ENCOUNTER — Other Ambulatory Visit (HOSPITAL_COMMUNITY): Payer: Self-pay | Admitting: Family Medicine

## 2021-06-15 DIAGNOSIS — Z1231 Encounter for screening mammogram for malignant neoplasm of breast: Secondary | ICD-10-CM

## 2021-07-01 ENCOUNTER — Other Ambulatory Visit: Payer: Self-pay

## 2021-07-01 ENCOUNTER — Ambulatory Visit (HOSPITAL_COMMUNITY)
Admission: RE | Admit: 2021-07-01 | Discharge: 2021-07-01 | Disposition: A | Discharge status: Home / Self Care | Payer: Medicare Other | Source: Ambulatory Visit | Attending: Family Medicine | Admitting: Family Medicine

## 2021-07-01 DIAGNOSIS — Z1231 Encounter for screening mammogram for malignant neoplasm of breast: Secondary | ICD-10-CM | POA: Insufficient documentation

## 2021-07-07 ENCOUNTER — Other Ambulatory Visit (HOSPITAL_COMMUNITY): Payer: Self-pay | Admitting: Family Medicine

## 2021-07-07 DIAGNOSIS — R928 Other abnormal and inconclusive findings on diagnostic imaging of breast: Secondary | ICD-10-CM

## 2021-07-08 ENCOUNTER — Ambulatory Visit (HOSPITAL_COMMUNITY)
Admission: RE | Admit: 2021-07-08 | Discharge: 2021-07-08 | Disposition: A | Discharge status: Home / Self Care | Payer: Medicare Other | Source: Ambulatory Visit | Attending: Family Medicine | Admitting: Family Medicine

## 2021-07-08 ENCOUNTER — Other Ambulatory Visit: Payer: Self-pay

## 2021-07-08 DIAGNOSIS — R928 Other abnormal and inconclusive findings on diagnostic imaging of breast: Secondary | ICD-10-CM | POA: Insufficient documentation

## 2021-10-06 ENCOUNTER — Encounter: Payer: Self-pay | Admitting: *Deleted

## 2021-11-24 ENCOUNTER — Ambulatory Visit (INDEPENDENT_AMBULATORY_CARE_PROVIDER_SITE_OTHER): Payer: Self-pay | Admitting: *Deleted

## 2021-11-24 ENCOUNTER — Other Ambulatory Visit: Payer: Self-pay

## 2021-11-24 VITALS — Ht 66.0 in | Wt 209.0 lb

## 2021-11-24 DIAGNOSIS — Z1211 Encounter for screening for malignant neoplasm of colon: Secondary | ICD-10-CM

## 2021-11-24 NOTE — Progress Notes (Addendum)
Gastroenterology Pre-Procedure Review  Request Date: 11/24/2021 Requesting Physician: Dr. Sudie Bailey, Last TCS done 08/25/2012 by Dr. Darrick Penna, normal colon, diverticulosis, small internal hemorrhoids, 10 year recall   PATIENT REVIEW QUESTIONS: The patient responded to the following health history questions as indicated:    1. Diabetes Melitis: yes, type II  2. Joint replacements in the past 12 months: no 3. Major health problems in the past 3 months: no 4. Has an artificial valve or MVP: no 5. Has a defibrillator: no 6. Has been advised in past to take antibiotics in advance of a procedure like teeth cleaning: no 7. Family history of colon cancer: no 8. Alcohol Use: no 9. Illicit drug Use: no 10. History of sleep apnea: no 11. History of coronary artery or other vascular stents placed within the last 12 months: no 12. History of any prior anesthesia complications: no 13. Body mass index is 33.73 kg/m.    MEDICATIONS & ALLERGIES:    Patient reports the following regarding taking any blood thinners:   Plavix? no Aspirin? Yes, 81 mg daily Coumadin? no Brilinta? no Xarelto? no Eliquis? no Pradaxa? no Savaysa? no Effient? no  Patient confirms/reports the following medications:  Current Outpatient Medications  Medication Sig Dispense Refill   aspirin 81 MG chewable tablet Chew 81 mg by mouth daily.     atorvastatin (LIPITOR) 20 MG tablet 20 mg daily at 6 (six) AM.     bisoprolol-hydrochlorothiazide (ZIAC) 10-6.25 MG per tablet Take 1 tablet by mouth daily. Takes 2 tablets daily.     calcium & magnesium carbonates (MYLANTA) 311-232 MG per tablet Take 1 tablet by mouth as needed.     Calcium Carb-Cholecalciferol (CALCIUM PLUS VITAMIN D3) 600-12.5 MG-MCG CAPS Take by mouth daily at 6 (six) AM.     ciprofloxacin (CIPRO) 500 MG tablet Take 1 tablet by mouth 2 (two) times daily. Takes for 10 days when she has flare up.     ferrous sulfate 325 (65 FE) MG tablet Take 325 mg by mouth every  other day.     losartan (COZAAR) 50 MG tablet Take 100 mg by mouth daily.     Magnesium 250 MG TABS Take by mouth every other day.     metFORMIN (GLUCOPHAGE-XR) 500 MG 24 hr tablet daily at 6 (six) AM.     metroNIDAZOLE (FLAGYL) 500 MG tablet 500 mg. Takes for 10 days with flare ups.     ondansetron (ZOFRAN) 4 MG tablet Take 1 tablet (4 mg total) by mouth every 6 (six) hours. (Patient taking differently: Take 4 mg by mouth as needed.) 12 tablet 0   potassium chloride SA (K-DUR,KLOR-CON) 20 MEQ tablet 2 (two) times daily.  0   No current facility-administered medications for this visit.    Patient confirms/reports the following allergies:  Allergies  Allergen Reactions   Codeine Shortness Of Breath    No orders of the defined types were placed in this encounter.   AUTHORIZATION INFORMATION Primary Insurance: [facility],  ID #: [id number],  Group #: [id number] Pre-Cert / Auth required: Yes, approved online 02/23/1637-01/21/6598 Pre-Cert / Berkley Harvey #: [id number]  SCHEDULE INFORMATION: Procedure has been scheduled as follows:  Date: 12/22/2021, Time: 12:00 Location: [facility] with Dr. Marletta Lor  This Gastroenterology Pre-Precedure Review Form is being routed to the following provider(s): Lewie Loron, NP

## 2021-12-02 NOTE — Progress Notes (Signed)
ASA 2. Hold iron 10 days prior. No oral diabetes meds day of procedure.

## 2021-12-03 ENCOUNTER — Other Ambulatory Visit: Payer: Self-pay | Admitting: *Deleted

## 2021-12-03 ENCOUNTER — Encounter: Payer: Self-pay | Admitting: *Deleted

## 2021-12-03 DIAGNOSIS — Z1211 Encounter for screening for malignant neoplasm of colon: Secondary | ICD-10-CM

## 2021-12-03 MED ORDER — PEG 3350-KCL-NA BICARB-NACL 420 G PO SOLR: 4000.0000 mL | Freq: Once | ORAL | 0 refills | Status: AC

## 2021-12-03 NOTE — Progress Notes (Signed)
Spoke to pt.  Scheduled procedure for 12/22/2021 with arrival at 10:30 at [facility].  Offered to review prep instructions with pt.  She informed me to just mail everything.  She said she had one in her past and had helped several others so she is aware of everything.  Pt was advised to hold iron 10 days prior to procedure.  She was also advised  no oral diabetes meds day of procedure.   She was made aware that she needs labs drawn at [facility] on 12/18/2021.  Pt aware that I am mailing out prep instructions and all information discussed.  Pt aware that I sent in RX to pharmacy.

## 2021-12-03 NOTE — Progress Notes (Signed)
Spoke to pt.  Was going to get her scheduled.  She was on a bible study and requested to call me back.

## 2021-12-03 NOTE — Addendum Note (Signed)
Addended by: Noreene Larsson on: 12/03/2021 03:41 PM   Modules accepted: Orders

## 2021-12-07 ENCOUNTER — Other Ambulatory Visit: Payer: Self-pay | Admitting: *Deleted

## 2021-12-18 ENCOUNTER — Other Ambulatory Visit (HOSPITAL_COMMUNITY)
Admission: RE | Admit: 2021-12-18 | Discharge: 2021-12-18 | Disposition: A | Discharge status: Home / Self Care | Payer: Medicare Other | Source: Ambulatory Visit | Attending: Internal Medicine | Admitting: Internal Medicine

## 2021-12-18 DIAGNOSIS — Z1211 Encounter for screening for malignant neoplasm of colon: Secondary | ICD-10-CM | POA: Insufficient documentation

## 2021-12-18 LAB — BASIC METABOLIC PANEL
Anion gap: 8 (ref 5–15)
BUN: 15 mg/dL (ref 8–23)
CO2: 27 mmol/L (ref 22–32)
Calcium: 9.6 mg/dL (ref 8.9–10.3)
Chloride: 104 mmol/L (ref 98–111)
Creatinine, Ser: 0.67 mg/dL (ref 0.44–1.00)
GFR, Estimated: >60 mL/min (ref >60)
Glucose, Bld: 144 mg/dL — ABNORMAL HIGH (ref 70–99)
Potassium: 4.4 mmol/L (ref 3.5–5.1)
Sodium: 139 mmol/L (ref 135–145)

## 2021-12-21 ENCOUNTER — Telehealth: Payer: Self-pay | Admitting: Internal Medicine

## 2021-12-21 NOTE — Telephone Encounter (Signed)
Spoke to pt.  She was informed to let pharmacy know that she needs RX filled that is on file from 12/03/2021.  Pt voiced understanding. ?

## 2021-12-21 NOTE — Telephone Encounter (Signed)
Patient called and said that she does not have her prep. Please call her  ?

## 2021-12-22 ENCOUNTER — Encounter (HOSPITAL_COMMUNITY)
Admission: RE | Disposition: A | Discharge status: Home / Self Care | Payer: Self-pay | Source: Home / Self Care | Attending: Internal Medicine

## 2021-12-22 ENCOUNTER — Ambulatory Visit (HOSPITAL_COMMUNITY)
Admission: RE | Admit: 2021-12-22 | Discharge: 2021-12-22 | Disposition: A | Discharge status: Home / Self Care | Payer: Medicare Other | Attending: Internal Medicine | Admitting: Internal Medicine

## 2021-12-22 ENCOUNTER — Ambulatory Visit (HOSPITAL_COMMUNITY): Payer: Medicare Other | Admitting: Anesthesiology

## 2021-12-22 ENCOUNTER — Other Ambulatory Visit: Payer: Self-pay

## 2021-12-22 ENCOUNTER — Encounter (HOSPITAL_COMMUNITY): Payer: Self-pay

## 2021-12-22 ENCOUNTER — Ambulatory Visit (HOSPITAL_BASED_OUTPATIENT_CLINIC_OR_DEPARTMENT_OTHER): Payer: Medicare Other | Admitting: Anesthesiology

## 2021-12-22 DIAGNOSIS — Z1211 Encounter for screening for malignant neoplasm of colon: Secondary | ICD-10-CM

## 2021-12-22 DIAGNOSIS — Z87891 Personal history of nicotine dependence: Secondary | ICD-10-CM | POA: Insufficient documentation

## 2021-12-22 DIAGNOSIS — K648 Other hemorrhoids: Secondary | ICD-10-CM | POA: Insufficient documentation

## 2021-12-22 DIAGNOSIS — K649 Unspecified hemorrhoids: Secondary | ICD-10-CM | POA: Diagnosis not present

## 2021-12-22 DIAGNOSIS — E7800 Pure hypercholesterolemia, unspecified: Secondary | ICD-10-CM | POA: Insufficient documentation

## 2021-12-22 DIAGNOSIS — Z79899 Other long term (current) drug therapy: Secondary | ICD-10-CM | POA: Insufficient documentation

## 2021-12-22 DIAGNOSIS — Z7984 Long term (current) use of oral hypoglycemic drugs: Secondary | ICD-10-CM | POA: Insufficient documentation

## 2021-12-22 DIAGNOSIS — K5730 Diverticulosis of large intestine without perforation or abscess without bleeding: Secondary | ICD-10-CM | POA: Insufficient documentation

## 2021-12-22 DIAGNOSIS — I10 Essential (primary) hypertension: Secondary | ICD-10-CM | POA: Diagnosis not present

## 2021-12-22 DIAGNOSIS — Z8616 Personal history of COVID-19: Secondary | ICD-10-CM | POA: Diagnosis not present

## 2021-12-22 DIAGNOSIS — K219 Gastro-esophageal reflux disease without esophagitis: Secondary | ICD-10-CM | POA: Diagnosis not present

## 2021-12-22 HISTORY — PX: COLONOSCOPY WITH PROPOFOL: SHX5780

## 2021-12-22 LAB — GLUCOSE, CAPILLARY: Glucose-Capillary: 121 mg/dL — ABNORMAL HIGH (ref 70–99)

## 2021-12-22 SURGERY — COLONOSCOPY WITH PROPOFOL
Anesthesia: General

## 2021-12-22 MED ORDER — PROPOFOL 10 MG/ML IV BOLUS
INTRAVENOUS | Status: DC | PRN
  Administered 2021-12-22: 100 mg via INTRAVENOUS

## 2021-12-22 MED ORDER — LIDOCAINE HCL (CARDIAC) PF 100 MG/5ML IV SOSY
PREFILLED_SYRINGE | INTRAVENOUS | Status: DC | PRN
  Administered 2021-12-22: 50 mg via INTRAVENOUS

## 2021-12-22 MED ORDER — PROPOFOL 500 MG/50ML IV EMUL
INTRAVENOUS | Status: DC | PRN
  Administered 2021-12-22: 150 ug/kg/min via INTRAVENOUS

## 2021-12-22 MED ORDER — LACTATED RINGERS IV SOLN: INTRAVENOUS | Status: DC

## 2021-12-22 NOTE — Anesthesia Preprocedure Evaluation (Signed)
Anesthesia Evaluation  Patient identified by MRN, date of birth, ID band Patient awake    Reviewed: Allergy & Precautions, H&P , NPO status , Patient's Chart, lab work & pertinent test results, reviewed documented beta blocker date and time   Airway Mallampati: II  TM Distance: >3 FB Neck ROM: full    Dental no notable dental hx.    Pulmonary neg pulmonary ROS, former smoker,    Pulmonary exam normal breath sounds clear to auscultation       Cardiovascular Exercise Tolerance: Good hypertension, negative cardio ROS   Rhythm:regular Rate:Normal     Neuro/Psych negative neurological ROS  negative psych ROS   GI/Hepatic Neg liver ROS, GERD  Medicated,  Endo/Other  negative endocrine ROS  Renal/GU negative Renal ROS  negative genitourinary   Musculoskeletal   Abdominal   Peds  Hematology  (+) Blood dyscrasia, anemia ,   Anesthesia Other Findings   Reproductive/Obstetrics negative OB ROS                             Anesthesia Physical Anesthesia Plan  ASA: 2  Anesthesia Plan: General   Post-op Pain Management:    Induction:   PONV Risk Score and Plan: Propofol infusion  Airway Management Planned:   Additional Equipment:   Intra-op Plan:   Post-operative Plan:   Informed Consent: I have reviewed the patients History and Physical, chart, labs and discussed the procedure including the risks, benefits and alternatives for the proposed anesthesia with the patient or authorized representative who has indicated his/her understanding and acceptance.     Dental Advisory Given  Plan Discussed with: CRNA  Anesthesia Plan Comments:         Anesthesia Quick Evaluation  

## 2021-12-22 NOTE — Op Note (Signed)
[facility] ?Patient Name: Kelly Rios ?Procedure Date: 12/22/2021 10:11 AM ?MRN: [id number] ?Date of Birth: [birth year] ?Attending MD: Elon Alas. Abbey Chatters , DO ?CSN: [id number] ?Age: 71 ?Admit Type: Outpatient ?Procedure:                Colonoscopy ?Indications:              Screening for colorectal malignant neoplasm ?Providers:                Elon Alas. Abbey Chatters, DO, Janeece Riggers, RN, Crisann  ?                          Wynonia Lawman, Technician ?Referring MD:              ?Medicines:                See the Anesthesia note for documentation of the  ?                          administered medications ?Complications:            No immediate complications. ?Estimated Blood Loss:     Estimated blood loss: none. ?Procedure:                Pre-Anesthesia Assessment: ?                          - The anesthesia plan was to use monitored  ?                          anesthesia care (MAC). ?                          After obtaining informed consent, the colonoscope  ?                          was passed under direct vision. Throughout the  ?                          procedure, the patient's blood pressure, pulse, and  ?                          oxygen saturations were monitored continuously. The  ?                          PCF-HQ190L ([id number]) scope was introduced through  ?                          the anus and advanced to the the cecum, identified  ?                          by appendiceal orifice and ileocecal valve. The  ?                          colonoscopy was performed without difficulty. The  ?                          patient tolerated the procedure well. The quality  ?  of the bowel preparation was evaluated using the  ?                          BBPS [facility] Bowel Preparation Scale) with scores  ?                          of: Right Colon = 3, Transverse Colon = 3 and Left  ?                          Colon = 3 (entire mucosa seen well with no residual  ?                          staining, small  fragments of stool or opaque  ?                          liquid). The total BBPS score equals 9. ?Scope In: 11:04:49 AM ?Scope Out: 11:15:02 AM ?Scope Withdrawal Time: 0 hours 7 minutes 19 seconds  ?Total Procedure Duration: 0 hours 10 minutes 13 seconds  ?Findings: ?     The perianal and digital rectal examinations were normal. ?     Non-bleeding internal hemorrhoids were found during endoscopy. ?     Many small and large-mouthed diverticula were found in the entire colon. ?     The terminal ileum appeared normal. ?     The exam was otherwise without abnormality. ?Impression:               - Non-bleeding internal hemorrhoids. ?                          - Diverticulosis in the entire examined colon. ?                          - The examined portion of the ileum was normal. ?                          - The examination was otherwise normal. ?                          - No specimens collected. ?Moderate Sedation: ?     Per Anesthesia Care ?Recommendation:           - Patient has a contact number available for  ?                          emergencies. The signs and symptoms of potential  ?                          delayed complications were discussed with the  ?                          patient. Return to normal activities tomorrow.  ?                          Written discharge instructions were provided to the  ?  patient. ?                          - Resume previous diet. ?                          - Continue present medications. ?                          - Repeat colonoscopy in 10 years for screening  ?                          purposes if benefits outweigh risks ?                          - Return to GI clinic PRN. ?                          - Use fiber, for example Citrucel, Fibercon, Konsyl  ?                          or Metamucil. Drink at least 6 glasses of water  ?                          daily. ?Procedure Code(s):        --- Professional --- ?                          J1941, Colorectal  cancer screening; colonoscopy on  ?                          individual not meeting criteria for high risk ?Diagnosis Code(s):        --- Professional --- ?                          Z12.11, Encounter for screening for malignant  ?                          neoplasm of colon ?                          K64.8, Other hemorrhoids ?                          K57.30, Diverticulosis of large intestine without  ?                          perforation or abscess without bleeding ?CPT copyright 2019 American Medical Association. All rights reserved. ?The codes documented in this report are preliminary and upon coder review may  ?be revised to meet current compliance requirements. ?Elon Alas. Abbey Chatters, DO ?Elon Alas. [place], DO ?12/22/2021 11:17:36 AM ?This report has been signed electronically. ?Number of Addenda: 0 ?

## 2021-12-22 NOTE — H&P (Signed)
?Primary Care Physician:  Gareth Morgan, MD ?Primary Gastroenterologist:  Dr. Marletta Lor ? ?Pre-Procedure History & Physical: ?HPI:  Kelly Rios is a 71 y.o. female is here for a colonoscopy for colon cancer screening purposes.  Patient denies any family history of colorectal cancer.  No melena or hematochezia.  No abdominal pain or unintentional weight loss.  No change in bowel habits.  Overall feels well from a GI standpoint. ? ?Past Medical History:  ?Diagnosis Date  ? Anemia   ? COVID-19 virus infection 06/2020  ? Diverticulitis   ? GERD (gastroesophageal reflux disease)   ? High cholesterol   ? HTN (hypertension)   ? ? ?Past Surgical History:  ?Procedure Laterality Date  ? ABDOMINAL HYSTERECTOMY    ? CHOLECYSTECTOMY    ? COLONOSCOPY  08/25/2012  ? Procedure: COLONOSCOPY;  Surgeon: West Bali, MD;  Location: [facility];  Service: Endoscopy;  Laterality: N/A;  12:30-changed to 12:45 Soledad Gerlach notified  ? ENDOVENOUS ABLATION SAPHENOUS VEIN W/ LASER Right 06/28/2018  ? endovenous laser ablation R GSV by Fabienne Bruns MD  ? ENDOVENOUS ABLATION SAPHENOUS VEIN W/ LASER Left 07/19/2018  ? endovenous laser ablation Left greater saphenous vein by Fabienne Bruns MD   ? ESOPHAGOGASTRODUODENOSCOPY  08/25/2012  ? Procedure: ESOPHAGOGASTRODUODENOSCOPY (EGD);  Surgeon: West Bali, MD;  Location: [facility];  Service: Endoscopy;  Laterality: N/A;  ? PARTIAL HYSTERECTOMY    ? stab phlebectomy Left 04/25/2019  ? stab phlebectomy 10-20 incisions left leg by Fabienne Bruns MD   ? stab phlebectomy Right 05/23/2019  ? stab phlebectomy 10-20 incisions right leg by Fabienne Bruns MD   ? ? ?Prior to Admission medications   ?Medication Sig Start Date End Date Taking? Authorizing Provider  ?alum & mag hydroxide-simeth (MAALOX/MYLANTA) 200-200-20 MG/5ML suspension Take 15 mLs by mouth every 6 (six) hours as needed (diverticulitis flare).   Yes [provider]  ?aspirin EC 81 MG tablet Take 81 mg by mouth daily.  Swallow whole.   Yes [provider]  ?atorvastatin (LIPITOR) 20 MG tablet Take 20 mg by mouth at bedtime. 05/14/19  Yes [provider]  ?bisoprolol-hydrochlorothiazide (ZIAC) 10-6.25 MG per tablet Take 2 tablets by mouth daily.   Yes [provider]  ?Calcium Carb-Cholecalciferol (CALCIUM PLUS VITAMIN D3) 600-12.5 MG-MCG CAPS Take 1 tablet by mouth every other day.   Yes [provider]  ?cholecalciferol (VITAMIN D3) 25 MCG (1000 UNIT) tablet Take 2,000 Units by mouth daily.   Yes [provider]  ?ciprofloxacin (CIPRO) 500 MG tablet Take 500 mg by mouth 2 (two) times daily as needed (diverticulitis flare). Takes for 10 days when she has flare up. 05/08/18  Yes [provider]  ?ferrous sulfate 325 (65 FE) MG tablet Take 325 mg by mouth every other day.   Yes [provider]  ?losartan (COZAAR) 100 MG tablet Take 100 mg by mouth daily.   Yes [provider]  ?Magnesium 250 MG TABS Take 250 mg by mouth every other day.   Yes [provider]  ?metFORMIN (GLUCOPHAGE-XR) 500 MG 24 hr tablet Take 500 mg by mouth daily at 6 (six) AM. 06/26/19  Yes [provider]  ?metroNIDAZOLE (FLAGYL) 500 MG tablet Take 500 mg by mouth 2 (two) times daily as needed (diverticulitis flare). Takes for 10 days with flare ups. 12/24/16  Yes [provider]  ?NYSTATIN powder Apply 1 application topically daily. 07/20/21  Yes [provider]  ?pantoprazole (PROTONIX) 40 MG tablet  Take 40 mg by mouth 2 (two) times daily as needed (acid reflux).   Yes [provider]  ?potassium chloride SA (K-DUR,KLOR-CON) 20 MEQ tablet Take 20 mEq by mouth 2 (two) times daily. 01/23/17  Yes [provider]  ? ? ?Allergies as of 12/03/2021 - Review Complete 11/24/2021  ?Allergen Reaction Noted  ? Codeine Shortness Of Breath 01/05/2011  ? ? ?Family History  ?Problem Relation Age of Onset  ? Asthma Other   ? Diabetes Other   ? Colon cancer  Maternal Grandmother   ?     1s  ? Liver disease Neg Hx   ? ? ?Social History  ? ?Socioeconomic History  ? Marital status: Married  ?  Spouse name: Not on file  ? Number of children: 4  ? Years of education: Not on file  ? Highest education level: Not on file  ?Occupational History  ? Occupation: cleaner  ?  Employer: [employer]  ? Occupation: Orthoptist  ?Tobacco Use  ? Smoking status: Former  ?  Packs/day: 0.50  ?  Years: 15.00  ?  Pack years: 7.50  ?  Types: Cigarettes  ? Smokeless tobacco: Never  ? Tobacco comments:  ?  remote  ?Substance and Sexual Activity  ? Alcohol use: No  ? Drug use: No  ? Sexual activity: Not on file  ?Other Topics Concern  ? Not on file  ?Social History Narrative  ? Not on file  ? ?Social Determinants of Health  ? ?Financial Resource Strain: Not on file  ?Food Insecurity: Not on file  ?Transportation Needs: Not on file  ?Physical Activity: Not on file  ?Stress: Not on file  ?Social Connections: Not on file  ?Intimate Partner Violence: Not on file  ? ? ?Review of Systems: ?See HPI, otherwise negative ROS ? ?Physical Exam: ?Vital signs in last 24 hours: ?  ?  ?General:   Alert,  Well-developed, well-nourished, pleasant and cooperative in NAD ?Head:  Normocephalic and atraumatic. ?Eyes:  Sclera clear, no icterus.   Conjunctiva pink. ?Ears:  Normal auditory acuity. ?Nose:  No deformity, discharge,  or lesions. ?Mouth:  No deformity or lesions, dentition normal. ?Neck:  Supple; no masses or thyromegaly. ?Lungs:  Clear throughout to auscultation.   No wheezes, crackles, or rhonchi. No acute distress. ?Heart:  Regular rate and rhythm; no murmurs, clicks, rubs,  or gallops. ?Abdomen:  Soft, nontender and nondistended. No masses, hepatosplenomegaly or hernias noted. Normal bowel sounds, without guarding, and without rebound.   ?Msk:  Symmetrical without gross deformities. Normal posture. ?Extremities:  Without clubbing or edema. ?Neurologic:  Alert and  oriented x4;  grossly normal  neurologically. ?Skin:  Intact without significant lesions or rashes. ?Cervical Nodes:  No significant cervical adenopathy. ?Psych:  Alert and cooperative. Normal mood and affect. ? ?Impression/Plan: ?Kelly Rios is here for a colonoscopy to be performed for colon cancer screening purposes. ? ?The risks of the procedure including infection, bleed, or perforation as well as benefits, limitations, alternatives and imponderables have been reviewed with the patient. Questions have been answered. All parties agreeable. ? ?

## 2021-12-22 NOTE — Transfer of Care (Signed)
Immediate Anesthesia Transfer of Care Note ? ?Patient: Kelly Rios ? ?Procedure(s) Performed: COLONOSCOPY WITH PROPOFOL ? ?Patient Location: Endoscopy Unit ? ?Anesthesia Type:General ? ?Level of Consciousness: awake ? ?Airway & Oxygen Therapy: Patient Spontanous Breathing ? ?Post-op Assessment: Report given to RN and Post -op Vital signs reviewed and stable ? ?Post vital signs: Reviewed and stable ? ?Last Vitals:  ?Vitals Value Taken Time  ?BP    ?Temp    ?Pulse 67   ?Resp 18   ?SpO2 96%   ? ? ?Last Pain:  ?Vitals:  ? 12/22/21 1044  ?TempSrc: Oral  ?PainSc: 0-No pain  ?   ? ?Patients Stated Pain Goal: 8 (12/22/21 1044) ? ?Complications: No notable events documented. ?

## 2021-12-22 NOTE — Discharge Instructions (Addendum)

## 2021-12-23 NOTE — Anesthesia Postprocedure Evaluation (Signed)
Anesthesia Post Note ? ?Patient: Kelly Rios ? ?Procedure(s) Performed: COLONOSCOPY WITH PROPOFOL ? ?Patient location during evaluation: Phase II ?Anesthesia Type: General ?Level of consciousness: awake ?Pain management: pain level controlled ?Vital Signs Assessment: post-procedure vital signs reviewed and stable ?Respiratory status: spontaneous breathing and respiratory function stable ?Cardiovascular status: blood pressure returned to baseline and stable ?Postop Assessment: no headache and no apparent nausea or vomiting ?Anesthetic complications: no ?Comments: Late entry ? ? ?No notable events documented. ? ? ?Last Vitals:  ?Vitals:  ? 12/22/21 1044 12/22/21 1118  ?BP: (!) 173/69 (!) 143/59  ?Pulse: 64 66  ?Resp: 12 17  ?Temp: 36.4 ?C 36.7 ?C  ?SpO2: 97% 96%  ?  ?Last Pain:  ?Vitals:  ? 12/22/21 1118  ?TempSrc: Oral  ?PainSc: 0-No pain  ? ? ?  ?  ?  ?  ?  ?  ? ?Windell [place] ? ? ? ? ?

## 2021-12-25 ENCOUNTER — Encounter (HOSPITAL_COMMUNITY): Payer: Self-pay | Admitting: Internal Medicine

## 2022-01-08 IMAGING — US US BREAST*R* LIMITED INC AXILLA
1 series · 10 of 10 positions shown · non-contrast
Comparison: M dated 07/01/2021.

CLINICAL DATA: Screening recall for possible right breast mass.

EXAM:
DIGITAL DIAGNOSTIC UNILATERAL RIGHT MAMMOGRAM WITH TOMOSYNTHESIS AND
CAD; ULTRASOUND RIGHT BREAST LIMITED
TECHNIQUE: Right digital diagnostic mammography and breast tomosynthesis was
performed. The images were evaluated with computer-aided detection.;
Targeted ultrasound examination of the right breast was performed

[Series 1: us breast*right* limited inc axilla · 0.07mm/px · 10 of 10 slices shown]
[im 1/10]
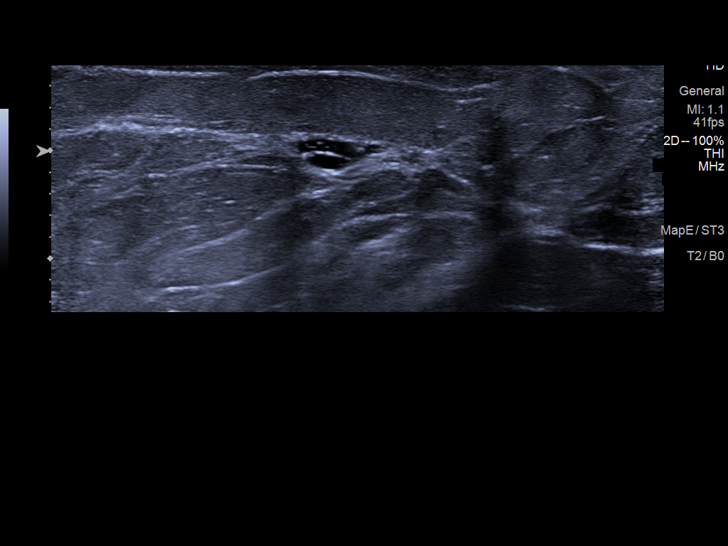
[im 2/10]
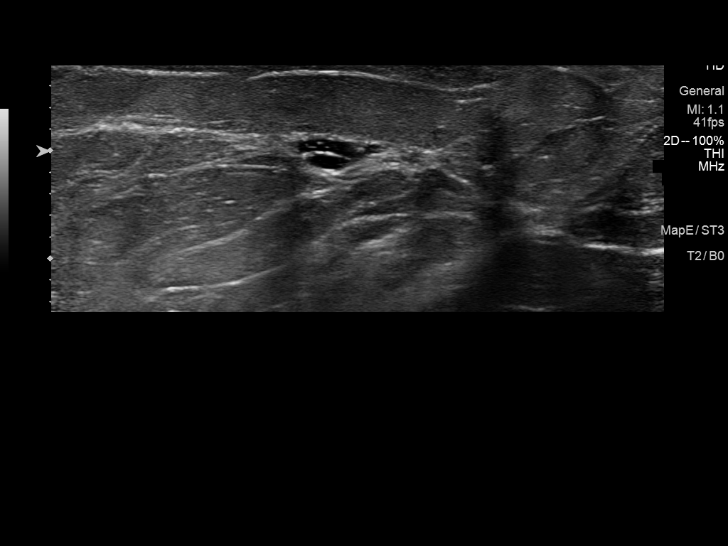
[im 3/10]
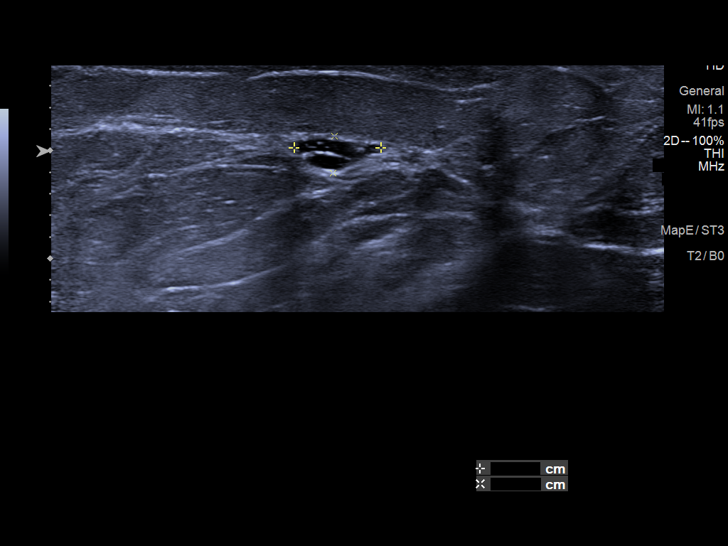
[im 4/10]
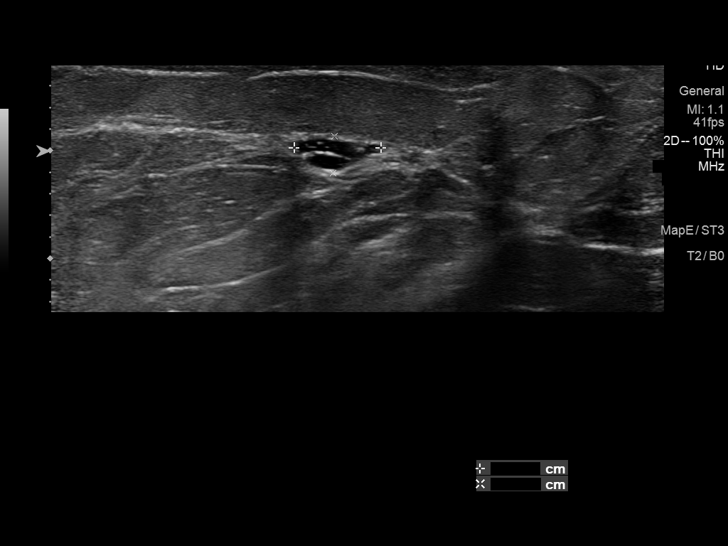
[im 5/10]
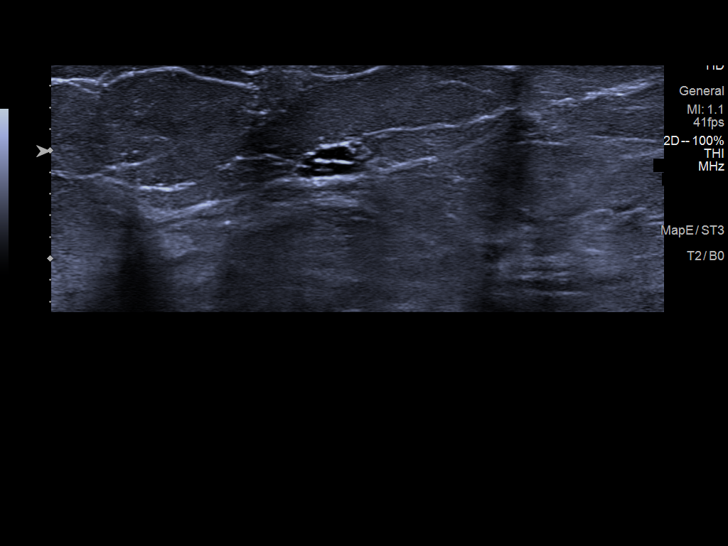
[im 6/10]
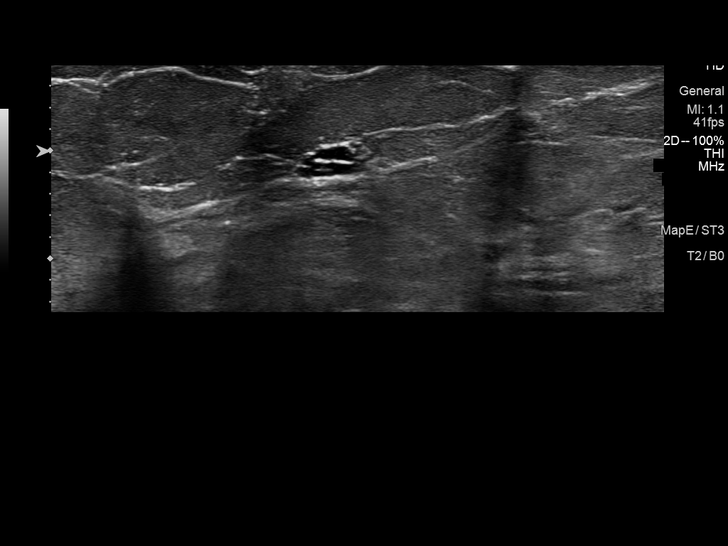
[im 7/10]
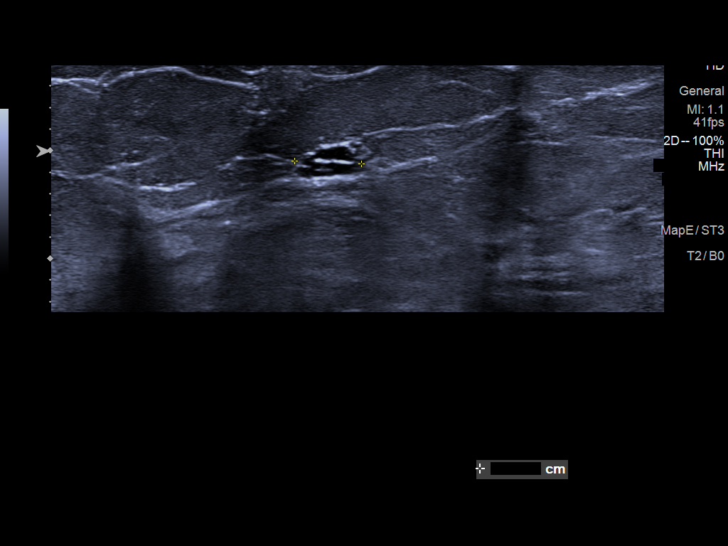
[im 8/10]
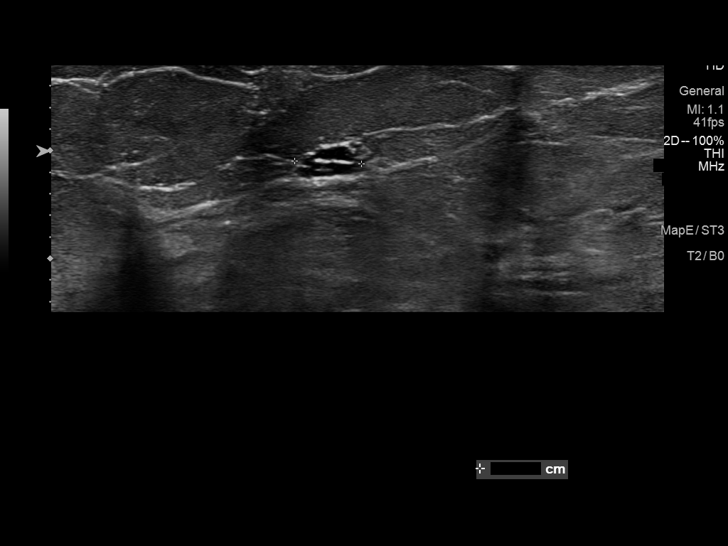
[im 9/10]
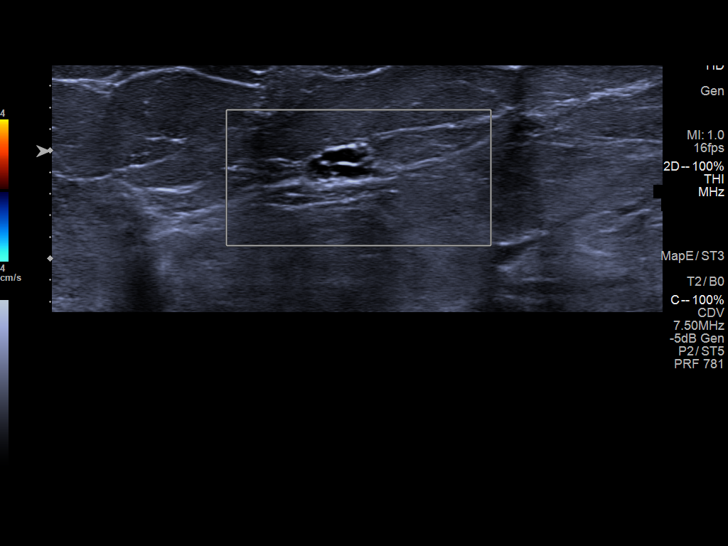
[im 10/10]
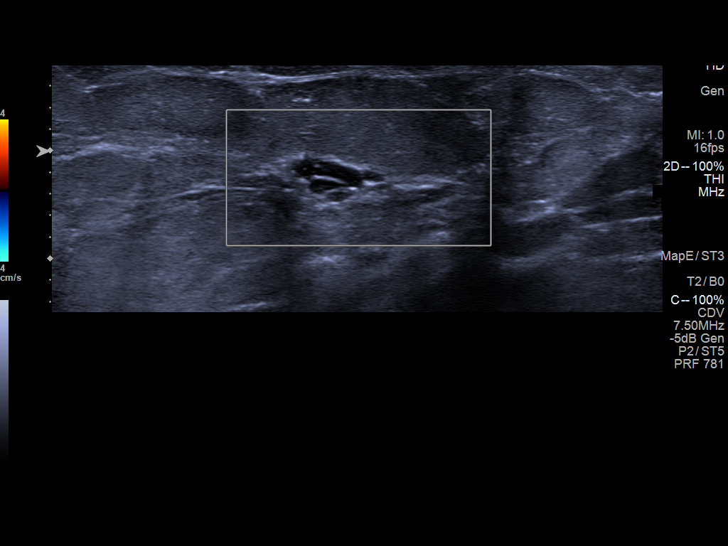

[10 of 10 positions shown; findings below may reference images not displayed]

ACR Breast Density Category b: There are scattered areas of
fibroglandular density.
FINDINGS: Spot compression tomograms were performed over the slightly outer
right breast demonstrating an oval circumscribed mass measuring
cm.

Targeted ultrasound of the right breast was performed demonstrating
a cluster of cysts at 8 o'clock 4 cm from nipple measuring 0.8 x
x 0.6 cm. This corresponds well with the mass seen in the right
breast at mammography.
IMPRESSION: No findings of malignancy in the right breast.

RECOMMENDATION:
Screening mammogram in one year.(Code:I7-4-INK)

I have discussed the findings and recommendations with the patient.
If applicable, a reminder letter will be sent to the patient
regarding the next appointment.

BI-RADS CATEGORY  2: Benign.

## 2022-01-08 IMAGING — MG MM DIGITAL DIAGNOSTIC UNILAT*R* W/ TOMO W/ CAD
4 series · 4 of 12 positions shown · non-contrast
Comparison: M dated 07/01/2021.

CLINICAL DATA: Screening recall for possible right breast mass.

EXAM:
DIGITAL DIAGNOSTIC UNILATERAL RIGHT MAMMOGRAM WITH TOMOSYNTHESIS AND
CAD; ULTRASOUND RIGHT BREAST LIMITED
TECHNIQUE: Right digital diagnostic mammography and breast tomosynthesis was
performed. The images were evaluated with computer-aided detection.;
Targeted ultrasound examination of the right breast was performed

[R MLO synth-2D]
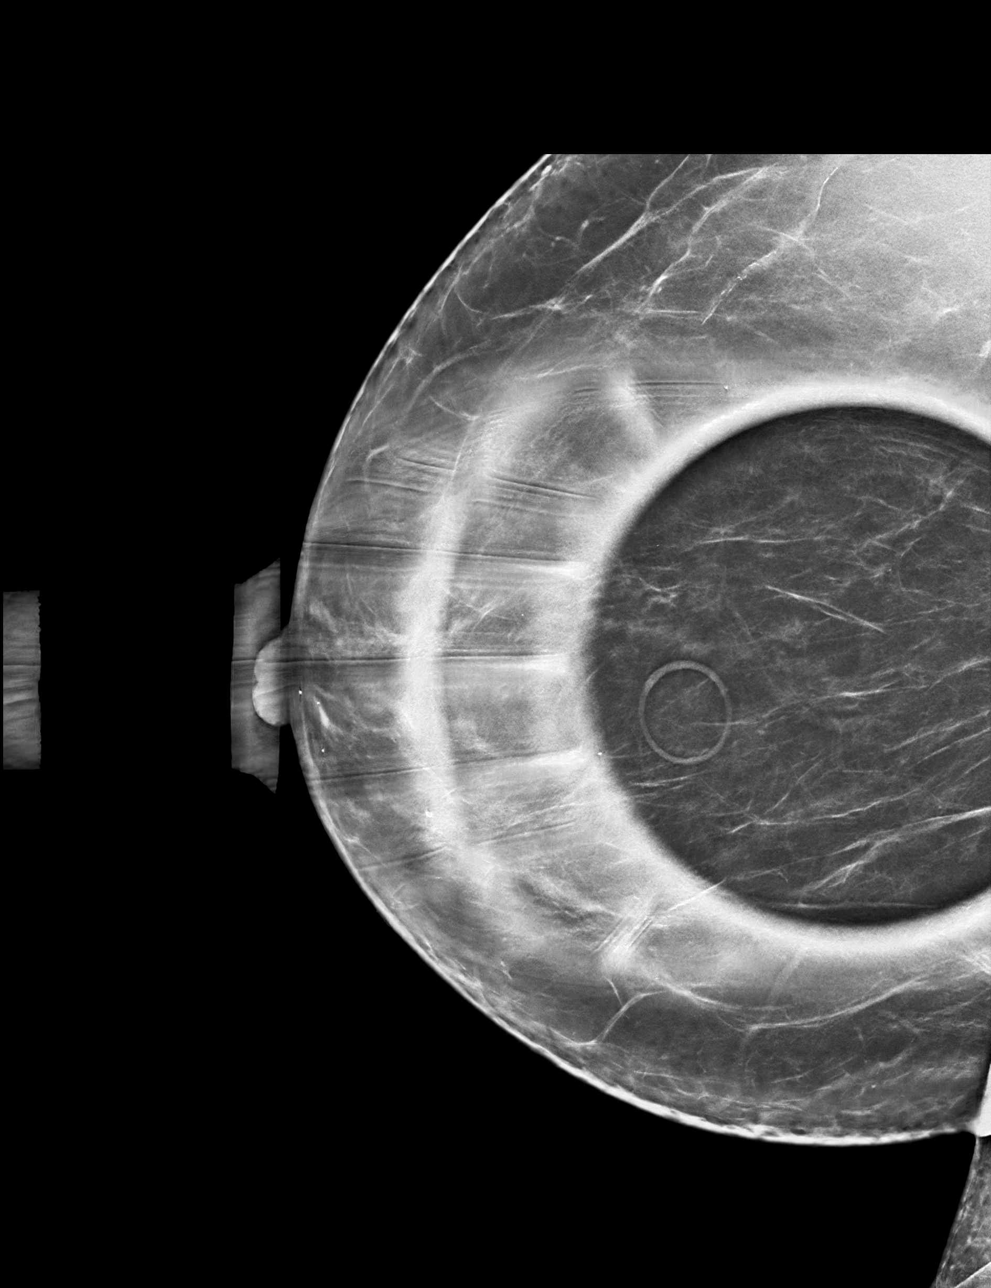

[R CC synth-2D]
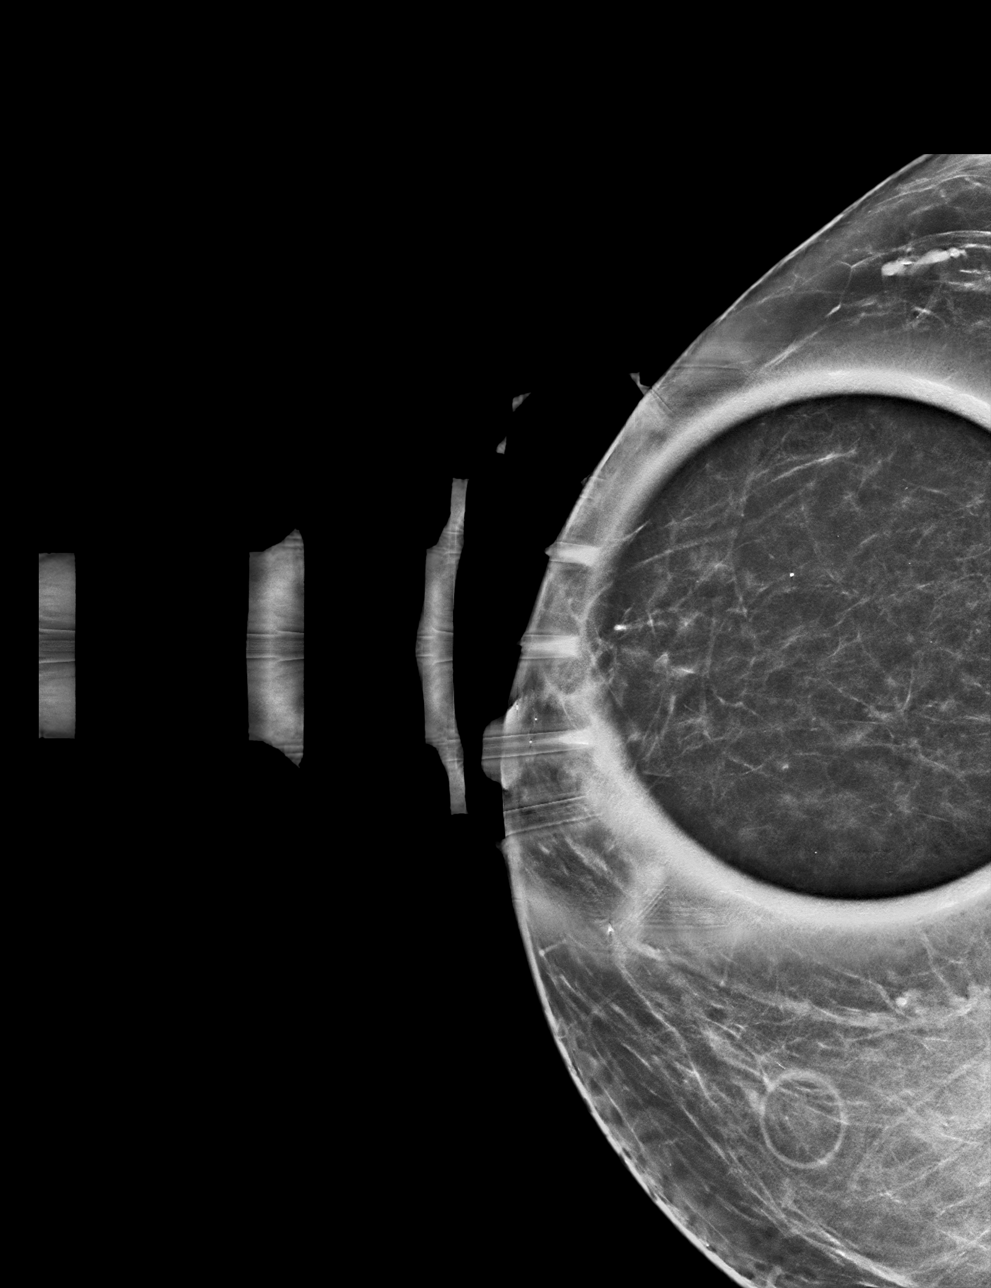

[R MLO tomo · tomo slice 37/74.0]
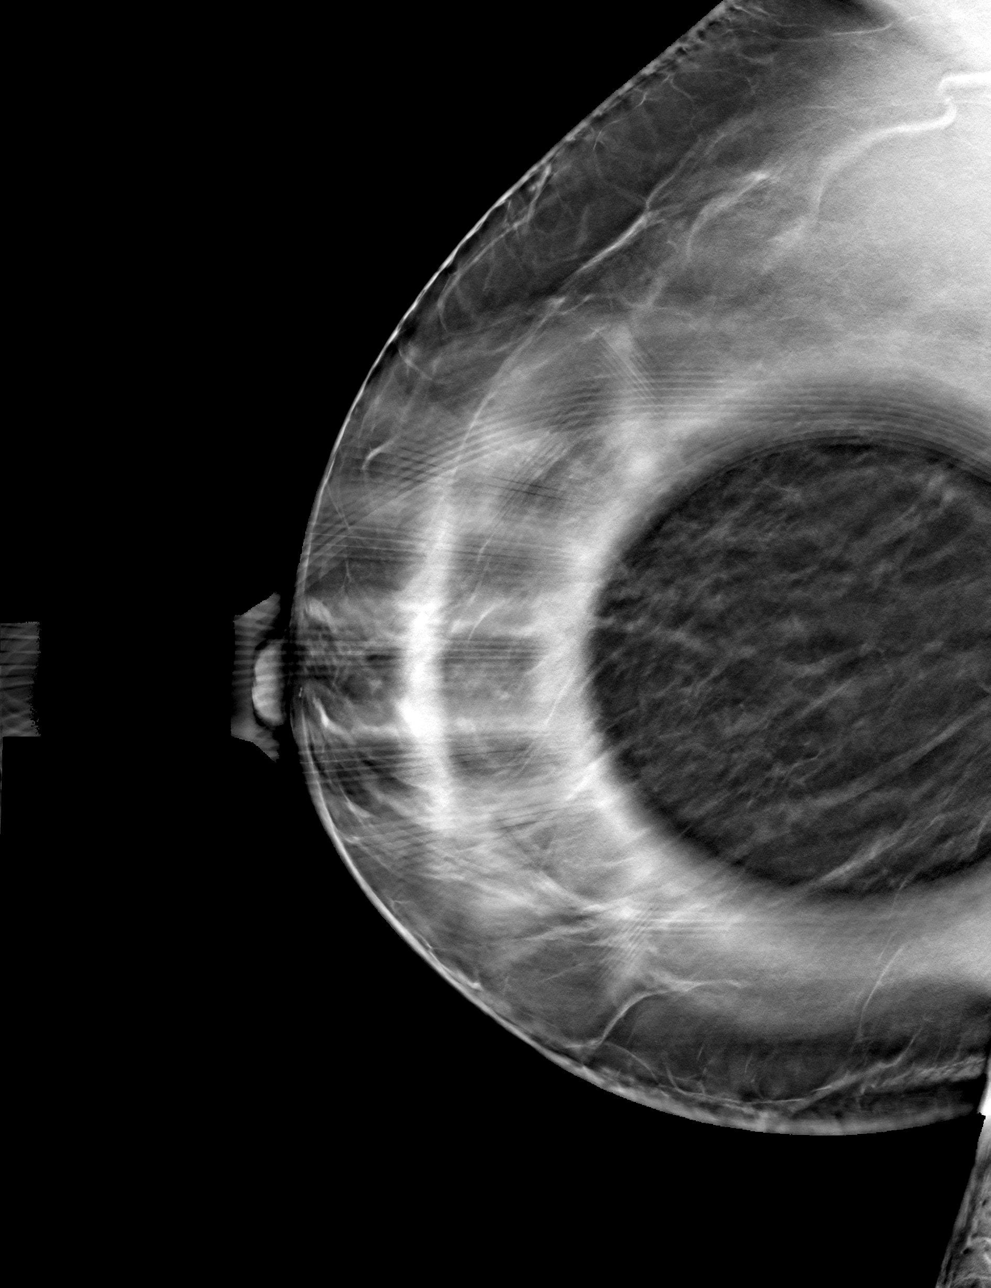

[R CC tomo · tomo slice 30/59.0]
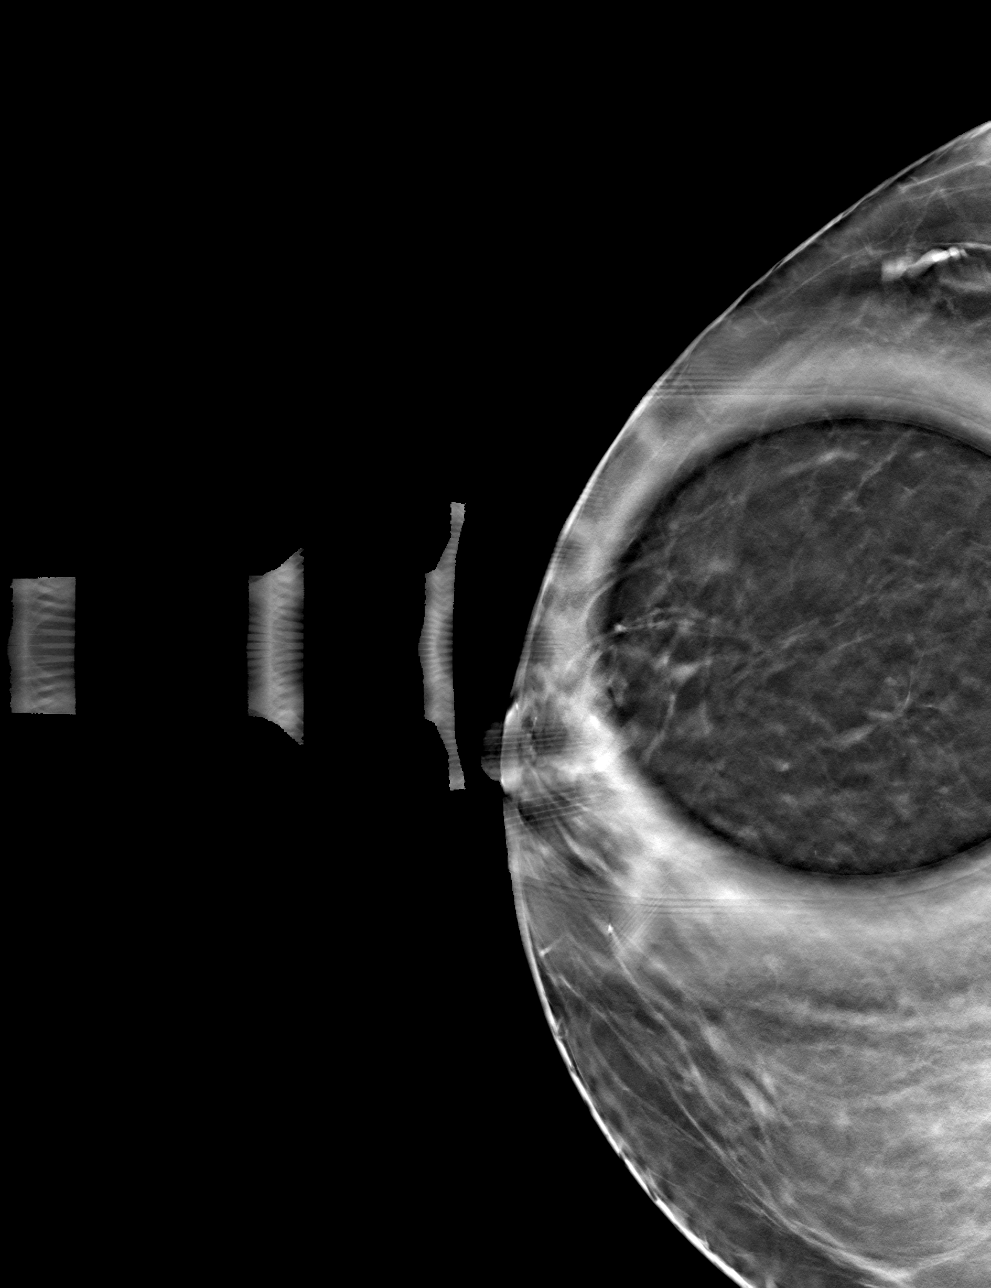

[4 of 12 positions shown; findings below may reference images not displayed]

ACR Breast Density Category b: There are scattered areas of
fibroglandular density.
FINDINGS: Spot compression tomograms were performed over the slightly outer
right breast demonstrating an oval circumscribed mass measuring
cm.

Targeted ultrasound of the right breast was performed demonstrating
a cluster of cysts at 8 o'clock 4 cm from nipple measuring 0.8 x
x 0.6 cm. This corresponds well with the mass seen in the right
breast at mammography.
IMPRESSION: No findings of malignancy in the right breast.

RECOMMENDATION:
Screening mammogram in one year.(Code:I7-4-INK)

I have discussed the findings and recommendations with the patient.
If applicable, a reminder letter will be sent to the patient
regarding the next appointment.

BI-RADS CATEGORY  2: Benign.

## 2022-08-09 ENCOUNTER — Other Ambulatory Visit: Payer: Self-pay | Admitting: Nurse Practitioner

## 2022-08-09 ENCOUNTER — Other Ambulatory Visit (HOSPITAL_COMMUNITY): Payer: Self-pay | Admitting: Nurse Practitioner

## 2022-08-09 ENCOUNTER — Ambulatory Visit (HOSPITAL_COMMUNITY)
Admission: RE | Admit: 2022-08-09 | Discharge: 2022-08-09 | Disposition: A | Discharge status: Home / Self Care | Payer: Medicare Other | Source: Ambulatory Visit | Attending: Nurse Practitioner | Admitting: Nurse Practitioner

## 2022-08-09 DIAGNOSIS — R110 Nausea: Secondary | ICD-10-CM | POA: Diagnosis present

## 2022-08-09 DIAGNOSIS — R109 Unspecified abdominal pain: Secondary | ICD-10-CM | POA: Diagnosis present

## 2022-08-09 LAB — POCT I-STAT CREATININE: Creatinine, Ser: 0.8 mg/dL (ref 0.44–1.00)

## 2022-08-09 MED ORDER — IOHEXOL 300 MG/ML  SOLN
100.0000 mL | Freq: Once | INTRAMUSCULAR | Status: AC | PRN
Start: 2022-08-09 — End: 2022-08-09
  Administered 2022-08-09: 100 mL via INTRAVENOUS

## 2022-12-28 ENCOUNTER — Telehealth: Payer: Self-pay

## 2022-12-28 DIAGNOSIS — I83893 Varicose veins of bilateral lower extremities with other complications: Secondary | ICD-10-CM

## 2022-12-28 NOTE — Telephone Encounter (Signed)
Pt called stating that she saw Dr. Oneida Alar years ago and wasn't sure if he was at this practice, but she was requesting an appt.  Reviewed pt's chart, returned call for clarification, two identifiers used. Pt stated that she had done well after her previous intervention. She has not been wearing compressions, but has also retired and is not standing for long periods of time anymore. When asked if she noticed spider veins or varicose, she stated it was varicose. Appts scheduled for [place] and [place] on Dr. Trula Tindol office day. Confirmed understanding.

## 2023-01-10 ENCOUNTER — Ambulatory Visit (HOSPITAL_COMMUNITY)
Admission: RE | Admit: 2023-01-10 | Discharge: 2023-01-10 | Disposition: A | Discharge status: Home / Self Care | Payer: Medicare Other | Source: Ambulatory Visit | Attending: Surgery | Admitting: Surgery

## 2023-01-10 ENCOUNTER — Ambulatory Visit (INDEPENDENT_AMBULATORY_CARE_PROVIDER_SITE_OTHER): Payer: Medicare Other | Admitting: Physician Assistant

## 2023-01-10 VITALS — BP 179/79 | HR 60 | Temp 97.6°F | Resp 16 | Ht 66.0 in | Wt 205.0 lb

## 2023-01-10 DIAGNOSIS — I8393 Asymptomatic varicose veins of bilateral lower extremities: Secondary | ICD-10-CM

## 2023-01-10 DIAGNOSIS — I83811 Varicose veins of right lower extremities with pain: Secondary | ICD-10-CM | POA: Diagnosis not present

## 2023-01-10 DIAGNOSIS — I872 Venous insufficiency (chronic) (peripheral): Secondary | ICD-10-CM

## 2023-01-10 DIAGNOSIS — I83893 Varicose veins of bilateral lower extremities with other complications: Secondary | ICD-10-CM | POA: Insufficient documentation

## 2023-01-12 NOTE — Progress Notes (Signed)
Office Note   History of Present Illness   Kelly Rios is a 72 y.o. (05/02/1951) female who presents for evaluation of painful right lower extremity varicose veins and swelling.  She has previously underwent bilateral greater saphenous vein ablations by Dr. Oneida Alar in 2019.  She has also undergone right lower extremity stab avulsion procedure in 2020.  She returns today for follow-up.  She states that over the past year she has developed worsening right lower extremity swelling and painful varicosities.  She also has painful spider veins in the lower leg.  She is still wearing her thigh-high compression stockings after her procedures in 2019 in 2020, but she has now worn them for years now.  She endorses not elevating her legs as much as she should for her leg swelling.  Her varicose veins go cause her an aching or stabbing pain intermittently, worsened by standing for prolonged periods of time.  She denies any bleeding events or history of DVT.  Past Medical History:  Diagnosis Date   Anemia    COVID-19 virus infection 06/2020   Diverticulitis    GERD (gastroesophageal reflux disease)    High cholesterol    HTN (hypertension)     Past Surgical History:  Procedure Laterality Date   ABDOMINAL HYSTERECTOMY     CHOLECYSTECTOMY     COLONOSCOPY  08/25/2012   Procedure: COLONOSCOPY;  Surgeon: Danie Binder, MD;  Location: [facility];  Service: Endoscopy;  Laterality: N/A;  12:30-changed to 12:45 Darius Bump notified   COLONOSCOPY WITH PROPOFOL N/A 12/22/2021   Procedure: COLONOSCOPY WITH PROPOFOL;  Surgeon: Eloise Harman, DO;  Location: [facility];  Service: Endoscopy;  Laterality: N/A;  12:00 / ASA 2   ENDOVENOUS ABLATION SAPHENOUS VEIN W/ LASER Right 06/28/2018   endovenous laser ablation R GSV by Ruta Hinds MD   ENDOVENOUS ABLATION SAPHENOUS VEIN W/ LASER Left 07/19/2018   endovenous laser ablation Left  greater saphenous vein by Ruta Hinds MD    ESOPHAGOGASTRODUODENOSCOPY  08/25/2012   Procedure: ESOPHAGOGASTRODUODENOSCOPY (EGD);  Surgeon: Danie Binder, MD;  Location: [facility];  Service: Endoscopy;  Laterality: N/A;   PARTIAL HYSTERECTOMY     stab phlebectomy Left 04/25/2019   stab phlebectomy 10-20 incisions left leg by Ruta Hinds MD    stab phlebectomy Right 05/23/2019   stab phlebectomy 10-20 incisions right leg by Ruta Hinds MD     Social History   Socioeconomic History   Marital status: Married    Spouse name: Not on file   Number of children: 4   Years of education: Not on file   Highest education level: Not on file  Occupational History   Occupation: Animal nutritionist: [employer]   Occupation: Optician, dispensing  Tobacco Use   Smoking status: Former    Packs/day: 0.50    Years: 15.00    Additional pack years: 0.00    Total pack years: 7.50    Types: Cigarettes   Smokeless tobacco: Never   Tobacco comments:    remote  Media planner  Vaping Use: Never used  Substance and Sexual Activity   Alcohol use: No   Drug use: No   Sexual activity: Not on file  Other Topics Concern   Not on file  Social History Narrative   Not on file   Social Determinants of Health   Financial Resource Strain: Not on file  Food Insecurity: Not on file  Transportation Needs: Not on file  Physical Activity: Not on file  Stress: Not on file  Social Connections: Not on file  Intimate Partner Violence: Not on file    Family History  Problem Relation Age of Onset   Asthma Other    Diabetes Other    Colon cancer Maternal Grandmother        70s   Liver disease Neg Hx     Current Outpatient Medications  Medication Sig Dispense Refill   alum & mag hydroxide-simeth (MAALOX/MYLANTA) 200-200-20 MG/5ML suspension Take 15 mLs by mouth every 6 (six) hours as needed (diverticulitis flare).     aspirin EC 81 MG tablet Take 81 mg by mouth daily. Swallow whole.     atorvastatin  (LIPITOR) 20 MG tablet Take 20 mg by mouth at bedtime.     bisoprolol-hydrochlorothiazide (ZIAC) 10-6.25 MG per tablet Take 2 tablets by mouth daily.     Calcium Carb-Cholecalciferol (CALCIUM PLUS VITAMIN D3) 600-12.5 MG-MCG CAPS Take 1 tablet by mouth every other day.     cholecalciferol (VITAMIN D3) 25 MCG (1000 UNIT) tablet Take 2,000 Units by mouth daily.     ciprofloxacin (CIPRO) 500 MG tablet Take 500 mg by mouth 2 (two) times daily as needed (diverticulitis flare). Takes for 10 days when she has flare up.     ferrous sulfate 325 (65 FE) MG tablet Take 325 mg by mouth every other day.     losartan (COZAAR) 100 MG tablet Take 100 mg by mouth daily.     Magnesium 250 MG TABS Take 250 mg by mouth every other day.     metFORMIN (GLUCOPHAGE-XR) 500 MG 24 hr tablet Take 500 mg by mouth daily at 6 (six) AM.     metroNIDAZOLE (FLAGYL) 500 MG tablet Take 500 mg by mouth 2 (two) times daily as needed (diverticulitis flare). Takes for 10 days with flare ups.     NYSTATIN powder Apply 1 application topically daily.     pantoprazole (PROTONIX) 40 MG tablet Take 40 mg by mouth 2 (two) times daily as needed (acid reflux).     potassium chloride SA (K-DUR,KLOR-CON) 20 MEQ tablet Take 20 mEq by mouth 2 (two) times daily.  0   No current facility-administered medications for this visit.    Allergies  Allergen Reactions   Codeine Shortness Of Breath    Couldn't breath    REVIEW OF SYSTEMS (negative unless checked):   Cardiac:  []  Chest pain or chest pressure? []  Shortness of breath upon activity? []  Shortness of breath when lying flat? []  Irregular heart rhythm?  Vascular:  []  Pain in calf, thigh, or hip brought on by walking? []  Pain in feet at night that wakes you up from your sleep? []  Blood clot in your veins? [x]  Leg swelling?  Pulmonary:  []  Oxygen at home? []  Productive cough? []  Wheezing?  Neurologic:  []  Sudden weakness in arms or legs? []  Sudden numbness in arms or legs? []   Sudden onset of difficult speaking or slurred speech? []  Temporary loss of vision in one eye? []  Problems with dizziness?  Gastrointestinal:  []  Blood  in stool? []  Vomited blood?  Genitourinary:  []  Burning when urinating? []  Blood in urine?  Psychiatric:  []  Major depression  Hematologic:  []  Bleeding problems? []  Problems with blood clotting?  Dermatologic:  []  Rashes or ulcers?  Constitutional:  []  Fever or chills?  Ear/Nose/Throat:  []  Change in hearing? []  Nose bleeds? []  Sore throat?  Musculoskeletal:  []  Back pain? []  Joint pain? []  Muscle pain?   Physical Examination     Vitals:   01/10/23 1252  BP: (!) 179/79  Pulse: 60  Resp: 16  Temp: 97.6 F (36.4 C)  TempSrc: Temporal  SpO2: 99%  Weight: 205 lb (93 kg)  Height: 5\' 6"  (1.676 m)   Body mass index is 33.09 kg/m.  General:  WDWN in NAD; vital signs documented above Gait: Not observed HENT: WNL, normocephalic Pulmonary: normal non-labored breathing  Cardiac: regular Abdomen: soft, NT, no masses Skin: without rashes Vascular Exam/Pulses: palpable right DP pulse Extremities: 2+ edema in right lower extremity with scattered varicose veins and spider veins across the right calf and shin.  No ulcerations or cellulitis Musculoskeletal: no muscle wasting or atrophy  Neurologic: A&O X 3;  No focal weakness or paresthesias are detected Psychiatric:  The pt has Normal affect.  Non-invasive Vascular Imaging   RLE Venous Insufficiency Duplex (01/10/2023):  +--------------+---------+------+-----------+------------+-------------+  RIGHT        Reflux NoRefluxReflux TimeDiameter cmsComments                               Yes                                        +--------------+---------+------+-----------+------------+-------------+  CFV                    yes   >1 second                            +--------------+---------+------+-----------+------------+-------------+  FV  mid        no                                                   +--------------+---------+------+-----------+------------+-------------+  Popliteal    no                                                   +--------------+---------+------+-----------+------------+-------------+  GSV at SFJ              yes    >500 ms      0.51                   +--------------+---------+------+-----------+------------+-------------+  GSV prox thigh          yes    >500 ms      0.73                   +--------------+---------+------+-----------+------------+-------------+  GSV mid thigh           yes    >500 ms  0.62                   +--------------+---------+------+-----------+------------+-------------+  GSV dist thigh          yes    >500 ms      0.60    mid to distal  +--------------+---------+------+-----------+------------+-------------+  GSV at knee             yes    >500 ms      0.59    out of fascia  +--------------+---------+------+-----------+------------+-------------+  GSV prox calf                                       NV             +--------------+---------+------+-----------+------------+-------------+  GSV mid calf                                        NV             +--------------+---------+------+-----------+------------+-------------+  SSV Pop Fossa no                            0.31                   +--------------+---------+------+-----------+------------+-------------+  SSV prox calf no                            0.16                   +--------------+---------+------+-----------+------------+-------------+  SSV mid calf                                0.26                   +--------------+---------+------+-----------+------------+-------------+  AASV o        no                                    NV             +--------------+---------+------+-----------+------------+-------------+     Medical Decision Making   COLA GRAINER is a 72 y.o. female who presents for repeat evaluation of right lower extremity varicose veins and swelling  Based on the patient's right lower extremity venous duplex study, the right greater saphenous vein has reopened and demonstrates reflux from the saphenofemoral junction to the knee.  The the vein is greater than 4 mm in size, and with therefore be a candidate for repeat ablation Since her bilateral saphenous vein ablations in 2019 and stab avulsions in 2020 by Dr. Oneida Alar, she has slowly redeveloped painful varicosities in the right lower extremity with edema.  On exam today of the right lower extremity edema is 2+.  She has scattered varicosities and reticular veins in the right lower extremity.  Her varicose veins and spider veins cause her pain, aggravated by prolonged sitting and standing. She has not worn her thigh-high compression stockings since her procedures in 2020.  I have reassessed the patient for 20 to 30 mm thigh-high compression stockings today.  She will  wear these for the following 3 months and follow-up with an MD for discussion of repeat ablation therapy in the right lower extremity.  She can also be a candidate for repeat stab avulsions or stripping in the right lower extremity   Johnie Makki Emmit Alexanders, PA-C [facility]: [phone]  01/12/2023, 9:42 PM  Clinic MD: Trula Zylka

## 2023-04-14 ENCOUNTER — Other Ambulatory Visit (HOSPITAL_COMMUNITY): Payer: Self-pay | Admitting: Gerontology

## 2023-04-14 DIAGNOSIS — Z1231 Encounter for screening mammogram for malignant neoplasm of breast: Secondary | ICD-10-CM

## 2023-04-18 ENCOUNTER — Encounter: Payer: Self-pay | Admitting: Surgery

## 2023-04-18 ENCOUNTER — Ambulatory Visit: Payer: Medicare Other | Admitting: Surgery

## 2023-04-18 VITALS — BP 163/83 | HR 69 | Temp 97.9°F | Resp 20 | Ht 66.0 in | Wt 203.0 lb

## 2023-04-18 DIAGNOSIS — I83811 Varicose veins of right lower extremities with pain: Secondary | ICD-10-CM | POA: Diagnosis not present

## 2023-04-18 DIAGNOSIS — I8393 Asymptomatic varicose veins of bilateral lower extremities: Secondary | ICD-10-CM

## 2023-04-18 NOTE — Progress Notes (Signed)
[facility] [place]  Patient name: Kelly Rios MRN: [id number] DOB: 09-23-1951 Sex: female   REASON FOR VISIT:    Follow up  HISOTRY OF PRESENT ILLNESS:    Kelly Rios is a 72 y.o. female who was initially evaluated in [facility] in March 2024 for painful right lower extremity varicose veins and swelling.  She has a history of bilateral greater saphenous vein ablations by Dr. Darrick Penna in 2019.  She has also undergone stab phlebectomy in 2020.  She states that over the past year she has been having worsening right leg swelling and varicosities.  She also has spider veins in the leg that is bothering her.  She still wears her thigh-high compression socks.  Her veins cause aching and stabbing intermittently that are exacerbated by prolonged standing.  She does not have any episodes of bleeding or prior history of DVT.  She now has worsening swelling and significant pain in her right leg.  The veins themselves are tender to the touch.  She has not had any bleeding.  Her last ultrasound showed that the saphenous vein ablation previously done by Dr. Darrick Penna has reopened  The patient is medically managed for hypertension.  She is a former smoker.  She takes a statin for hypercholesterolemia.   PAST MEDICAL HISTORY:   Past Medical History:  Diagnosis Date   Anemia    COVID-19 virus infection 06/2020   Diverticulitis    GERD (gastroesophageal reflux disease)    High cholesterol    HTN (hypertension)      FAMILY HISTORY:   Family History  Problem Relation Age of Onset   Asthma Other    Diabetes Other    Colon cancer Maternal Grandmother        66s   Liver disease Neg Hx     SOCIAL HISTORY:   Social History   Tobacco Use   Smoking status: Former    Packs/day: 0.50    Years: 15.00    Additional pack years: 0.00    Total pack years: 7.50    Types: Cigarettes   Smokeless tobacco: Never   Tobacco comments:    remote   Substance Use Topics   Alcohol use: No     ALLERGIES:   Allergies  Allergen Reactions   Codeine Shortness Of Breath    Couldn't breath     CURRENT MEDICATIONS:   Current Outpatient Medications  Medication Sig Dispense Refill   alum & mag hydroxide-simeth (MAALOX/MYLANTA) 200-200-20 MG/5ML suspension Take 15 mLs by mouth every 6 (six) hours as needed (diverticulitis flare).     aspirin EC 81 MG tablet Take 81 mg by mouth daily. Swallow whole.     atorvastatin (LIPITOR) 20 MG tablet Take 20 mg by mouth at bedtime.     bisoprolol-hydrochlorothiazide (ZIAC) 10-6.25 MG per tablet Take 2 tablets by mouth daily.     Calcium Carb-Cholecalciferol (CALCIUM PLUS VITAMIN D3) 600-12.5 MG-MCG CAPS Take 1 tablet by mouth every other day.     cholecalciferol (VITAMIN D3) 25 MCG (1000 UNIT) tablet Take 2,000 Units by mouth daily.     ciprofloxacin (CIPRO) 500 MG tablet Take 500 mg by mouth 2 (two) times daily as needed (diverticulitis flare). Takes for 10 days when she has flare up.     ferrous sulfate 325 (65 FE) MG tablet Take 325 mg by mouth every other day.     losartan (COZAAR) 100 MG tablet Take 100 mg by mouth daily.  Magnesium 250 MG TABS Take 250 mg by mouth every other day.     metFORMIN (GLUCOPHAGE-XR) 500 MG 24 hr tablet Take 500 mg by mouth daily at 6 (six) AM.     metroNIDAZOLE (FLAGYL) 500 MG tablet Take 500 mg by mouth 2 (two) times daily as needed (diverticulitis flare). Takes for 10 days with flare ups.     NYSTATIN powder Apply 1 application topically daily.     pantoprazole (PROTONIX) 40 MG tablet Take 40 mg by mouth 2 (two) times daily as needed (acid reflux).     potassium chloride SA (K-DUR,KLOR-CON) 20 MEQ tablet Take 20 mEq by mouth 2 (two) times daily.  0   No current facility-administered medications for this visit.    REVIEW OF SYSTEMS:   [X]  denotes positive finding, [ ]  denotes negative finding Cardiac  Comments:  Chest pain or chest pressure:    Shortness  of breath upon exertion:    Short of breath when lying flat:    Irregular heart rhythm:        Vascular    Pain in calf, thigh, or hip brought on by ambulation:    Pain in feet at night that wakes you up from your sleep:     Blood clot in your veins:    Leg swelling:  x       Pulmonary    Oxygen at home:    Productive cough:     Wheezing:         Neurologic    Sudden weakness in arms or legs:     Sudden numbness in arms or legs:     Sudden onset of difficulty speaking or slurred speech:    Temporary loss of vision in one eye:     Problems with dizziness:         Gastrointestinal    Blood in stool:     Vomited blood:         Genitourinary    Burning when urinating:     Blood in urine:        Psychiatric    Major depression:         Hematologic    Bleeding problems:    Problems with blood clotting too easily:        Skin    Rashes or ulcers:        Constitutional    Fever or chills:      PHYSICAL EXAM:   Vitals:   04/18/23 1434  BP: (!) 163/83  Pulse: 69  Resp: 20  Temp: 97.9 F (36.6 C)  SpO2: 95%  Weight: 203 lb (92.1 kg)  Height: 5\' 6"  (1.676 m)    GENERAL: The patient is a well-nourished female, in no acute distress. The vital signs are documented above. CARDIAC: There is a regular rate and rhythm.  VASCULAR: SonoSite was used to evaluate the right saphenous vein.  This is markedly dilated and straight in caliber.  I also looked at her varicosities in the lower leg with the SonoSite.  There were multiple large veins visible.  She has significant right leg edema and brawny discoloration of the skin PULMONARY: Non-labored respirations MUSCULOSKELETAL: There are no major deformities or cyanosis. NEUROLOGIC: No focal weakness or paresthesias are detected. SKIN:    See photos below  PSYCHIATRIC: The patient has a normal affect.     STUDIES:   I have reviewed the following: RLE Venous Insufficiency Duplex (01/10/2023):   +--------------+---------+------+-----------+------------+-------------+  RIGHT  Reflux NoRefluxReflux TimeDiameter cmsComments                               Yes                                        +--------------+---------+------+-----------+------------+-------------+  CFV                    yes   >1 second                            +--------------+---------+------+-----------+------------+-------------+  FV mid        no                                                   +--------------+---------+------+-----------+------------+-------------+  Popliteal    no                                                   +--------------+---------+------+-----------+------------+-------------+  GSV at SFJ              yes    >500 ms      0.51                   +--------------+---------+------+-----------+------------+-------------+  GSV prox thigh          yes    >500 ms      0.73                   +--------------+---------+------+-----------+------------+-------------+  GSV mid thigh           yes    >500 ms      0.62                   +--------------+---------+------+-----------+------------+-------------+  GSV dist thigh          yes    >500 ms      0.60    mid to distal  +--------------+---------+------+-----------+------------+-------------+  GSV at knee             yes    >500 ms      0.59    out of fascia  +--------------+---------+------+-----------+------------+-------------+  GSV prox calf                                       NV             +--------------+---------+------+-----------+------------+-------------+  GSV mid calf                                        NV             +--------------+---------+------+-----------+------------+-------------+  SSV Pop Fossa no                            0.31                   +--------------+---------+------+-----------+------------+-------------+  SSV prox calf no                            0.16                   +--------------+---------+------+-----------+------------+-------------+  SSV mid calf                                0.26                   +--------------+---------+------+-----------+------------+-------------+  AASV o        no                                    NV             +--------------+---------+------+-----------+------------+-------------+    MEDICAL ISSUES:   CEAP class IV, right leg: Patient has previously undergone laser ablation of the right saphenous vein with stab phlebectomy.  Unfortunately, her saphenous vein has recanalized.  He now has significant reflux.  She is having terrible pain despite compression socks and elevation.  I have recommended that we repeat her laser ablation of the saphenous vein and perform greater than 20 stabs of her numerous varicosities in the calf.  I looked at the calf varicosities with ultrasound and you could see multiple dilated veins.  I will need to do this when she comes back for her ablation.    Charlena Cross, MD, FACS [facility] [phone] Pager [phone]

## 2023-05-26 ENCOUNTER — Other Ambulatory Visit: Payer: Self-pay | Admitting: *Deleted

## 2023-05-26 DIAGNOSIS — I83811 Varicose veins of right lower extremities with pain: Secondary | ICD-10-CM

## 2023-06-22 ENCOUNTER — Ambulatory Visit (HOSPITAL_COMMUNITY)
Admission: RE | Admit: 2023-06-22 | Discharge: 2023-06-22 | Disposition: A | Discharge status: Home / Self Care | Payer: Medicare Other | Source: Ambulatory Visit | Attending: Gerontology | Admitting: Gerontology

## 2023-06-22 DIAGNOSIS — Z1231 Encounter for screening mammogram for malignant neoplasm of breast: Secondary | ICD-10-CM | POA: Diagnosis present

## 2023-06-24 ENCOUNTER — Other Ambulatory Visit: Payer: Self-pay | Admitting: *Deleted

## 2023-06-24 MED ORDER — LORAZEPAM 1 MG PO TABS: ORAL_TABLET | ORAL | 0 refills | Status: DC

## 2023-06-30 ENCOUNTER — Other Ambulatory Visit: Payer: Medicare Other | Admitting: Surgery

## 2023-07-14 ENCOUNTER — Other Ambulatory Visit: Payer: Self-pay | Admitting: *Deleted

## 2023-07-14 ENCOUNTER — Encounter (HOSPITAL_COMMUNITY): Payer: Medicare Other

## 2023-07-14 ENCOUNTER — Encounter: Payer: Medicare Other | Admitting: Surgery

## 2023-07-14 DIAGNOSIS — I83811 Varicose veins of right lower extremities with pain: Secondary | ICD-10-CM

## 2023-07-28 ENCOUNTER — Ambulatory Visit: Payer: Medicare Other | Admitting: Surgery

## 2023-07-28 ENCOUNTER — Encounter: Payer: Self-pay | Admitting: Surgery

## 2023-07-28 VITALS — BP 132/82 | HR 63 | Temp 97.6°F | Resp 16 | Ht 66.0 in | Wt 208.0 lb

## 2023-07-28 DIAGNOSIS — I83811 Varicose veins of right lower extremities with pain: Secondary | ICD-10-CM | POA: Diagnosis not present

## 2023-07-28 HISTORY — PX: LASER ABLATION: SHX1947

## 2023-07-28 NOTE — Progress Notes (Signed)
     Laser Ablation Procedure    Date: 07/28/2023    Kelly Rios DOB:1951-01-09  Consent signed: Yes     Surgeon: Dr. Coral Else  Procedure: Laser Ablation: right Greater Saphenous Vein in two separate segments labels as segment A and Segment B.  BP 132/82 (BP Location: Left Arm, Patient Position: Sitting, Cuff Size: Normal)   Pulse 63   Temp 97.6 F (36.4 C) (Temporal)   Resp 16   Ht 5\' 6"  (1.676 m)   Wt 208 lb (94.3 kg)   SpO2 99%   BMI 33.57 kg/m   Tumescent Anesthesia: 475 cc 0.9% NaCl with 50 cc Lidocaine HCL 1%  and 15 cc 8.4% NaHCO3 Local Anesthesia: 7 cc Lidocaine HCL and NaHCO3 (ratio 2:1)  Segment A 7 watts continuous mode Total energy: 807.8 joules    Total time: 115 seconds  Treatment Length: 17 cm Laser Fiber Ref. # [id number]    Lot # [id number]  Segment B 7 watts continuous mode Total energy: 464.0 joules    Total time: 66 seconds  Treatment Length:10 cm  Laser Fiber Ref. # [id number] Lot# [id number]  Stab Phlebectomy: >20 Sites: Calf  Patient tolerated procedure well  Notes: All staff members wore facial masks. Patient had 1 tab (1 mg) Ativan at 6:45 am and another 1 tab (1 mg) at 8:00 am. Making a total of (2 mg) of ativan prior to the procedure   Description of Procedure: After marking the course of the secondary varicosities, the patient was placed on the operating table in the supine position, and the right leg was prepped and draped in sterile fashion.   Local anesthetic was administered and under ultrasound guidance the saphenous vein was accessed with a micro needle and guide wire; then the mirco puncture sheath was placed.  A guide wire was inserted saphenofemoral junction , followed by a 5 french sheath.  The position of the sheath and then the laser fiber below the junction was confirmed using the ultrasound.  Tumescent anesthesia was administered along the course of the saphenous vein using ultrasound guidance. The patient was placed in  Trendelenburg position and protective laser glasses were placed on patient and staff, and the laser was fired at 7 watts continuous mode for a total of segment A 807.8 joules and segment B 464.0 joules.  For stab phlebectomies, local anesthetic was administered at the previously marked varicosities, and tumescent anesthesia was administered around the vessels.  Greater than 20 stab wounds were made using the tip of an 11 blade. And using the vein hook, the phlebectomies were performed using a hemostat to avulse the varicosities.  Adequate hemostasis was achieved.   Steri strips were applied to the stab wounds and ABD pads and thigh high compression stockings were applied.  Ace wrap bandages were applied over the phlebectomy sites and at the top of the saphenofemoral junction. Blood loss was less than 15 cc.  Discharge instructions reviewed with patient and hardcopy of discharge instructions given to patient to take home. The patient was wheeled out by wheelchair out of  the operating room having tolerated the procedure well.

## 2023-08-11 ENCOUNTER — Ambulatory Visit: Payer: Medicare Other | Admitting: Surgery

## 2023-08-11 ENCOUNTER — Encounter: Payer: Self-pay | Admitting: Surgery

## 2023-08-11 ENCOUNTER — Ambulatory Visit (HOSPITAL_COMMUNITY)
Admission: RE | Admit: 2023-08-11 | Discharge: 2023-08-11 | Disposition: A | Discharge status: Home / Self Care | Payer: Medicare Other | Source: Ambulatory Visit | Attending: Surgery | Admitting: Surgery

## 2023-08-11 VITALS — BP 149/77 | HR 77 | Temp 97.6°F

## 2023-08-11 DIAGNOSIS — I83811 Varicose veins of right lower extremities with pain: Secondary | ICD-10-CM | POA: Diagnosis not present

## 2023-08-11 DIAGNOSIS — M7989 Other specified soft tissue disorders: Secondary | ICD-10-CM

## 2023-08-11 NOTE — Progress Notes (Signed)
Patient name: Kelly Rios MRN: [id number] DOB: 1950-12-04 Sex: female  REASON FOR VISIT:     Post op  HISTORY OF PRESENT ILLNESS:   Kelly Rios is a 72 y.o. female who is status post re-do right GSV laser ablation and >20 stabs on 07-28-2023 for class IV disease.  She is back today for follow up.  She is only complaining of discomfort at her stab sites.  CURRENT MEDICATIONS:    Current Outpatient Medications  Medication Sig Dispense Refill   alum & mag hydroxide-simeth (MAALOX/MYLANTA) 200-200-20 MG/5ML suspension Take 15 mLs by mouth every 6 (six) hours as needed (diverticulitis flare).     aspirin EC 81 MG tablet Take 81 mg by mouth daily. Swallow whole.     atorvastatin (LIPITOR) 20 MG tablet Take 20 mg by mouth at bedtime.     bisoprolol-hydrochlorothiazide (ZIAC) 10-6.25 MG per tablet Take 2 tablets by mouth daily.     Calcium Carb-Cholecalciferol (CALCIUM PLUS VITAMIN D3) 600-12.5 MG-MCG CAPS Take 1 tablet by mouth every other day.     cholecalciferol (VITAMIN D3) 25 MCG (1000 UNIT) tablet Take 2,000 Units by mouth daily.     ciprofloxacin (CIPRO) 500 MG tablet Take 500 mg by mouth 2 (two) times daily as needed (diverticulitis flare). Takes for 10 days when she has flare up.     ferrous sulfate 325 (65 FE) MG tablet Take 325 mg by mouth every other day.     losartan (COZAAR) 100 MG tablet Take 100 mg by mouth daily.     Magnesium 250 MG TABS Take 250 mg by mouth every other day.     metFORMIN (GLUCOPHAGE-XR) 500 MG 24 hr tablet Take 500 mg by mouth daily at 6 (six) AM.     metroNIDAZOLE (FLAGYL) 500 MG tablet Take 500 mg by mouth 2 (two) times daily as needed (diverticulitis flare). Takes for 10 days with flare ups.     NYSTATIN powder Apply 1 application topically daily.     pantoprazole (PROTONIX) 40 MG tablet Take 40 mg by mouth 2 (two) times daily as needed (acid reflux).     potassium chloride SA (K-DUR,KLOR-CON) 20 MEQ tablet Take 20 mEq  by mouth 2 (two) times daily.  0   No current facility-administered medications for this visit.    REVIEW OF SYSTEMS:   [X]  denotes positive finding, [ ]  denotes negative finding Cardiac  Comments:  Chest pain or chest pressure:    Shortness of breath upon exertion:    Short of breath when lying flat:    Irregular heart rhythm:    Constitutional    Fever or chills:      PHYSICAL EXAM:   Vitals:   08/11/23 1020  BP: (!) 149/77  Pulse: 77  Temp: 97.6 F (36.4 C)  TempSrc: Temporal  SpO2: 97%    GENERAL: The patient is a well-nourished female, in no acute distress. The vital signs are documented above. CARDIOVASCULAR: There is a regular rate and rhythm. PULMONARY: Non-labored respirations  STUDIES:   Right:  - No evidence of deep vein thrombosis seen in the right lower extremity,  from the common femoral through the popliteal veins.    - Successful laser ablation of the great saphenous vein from the distal  thigh to within 3.3cm of the saphenofemoral junction.     MEDICAL ISSUES:   -  Return for sclero when ready -- continue with compression stockings for leg pain -  encouraged exercise -u/s confirms successful  closure of the right GSV  Kelly Cross, MD, FACS [facility] [phone] Pager [phone]

## 2023-09-27 ENCOUNTER — Ambulatory Visit: Payer: Medicare Other

## 2023-09-27 DIAGNOSIS — I83893 Varicose veins of bilateral lower extremities with other complications: Secondary | ICD-10-CM

## 2023-09-27 NOTE — Progress Notes (Signed)
Treated pt's BLE spider and small reticular veins with Asclera 1%, administered with a 27g butterfly.  Patient received a total of 4 mL/40 mg of Asclera 1%. Pt tolerated well and appears to be healing well s/p RLE laser ablation with stabs. Pt was given post treatment care instructions on handout and verbally. Will call if she has any questions/concerns.   Photos: Yes.    Compression stockings applied: Yes.

## 2023-11-18 ENCOUNTER — Inpatient Hospital Stay (HOSPITAL_COMMUNITY)
Admission: EM | Admit: 2023-11-18 | Discharge: 2023-11-20 | DRG: 379 | Disposition: A | Discharge status: Home / Self Care | Payer: Medicare Other | Attending: Internal Medicine | Admitting: Internal Medicine

## 2023-11-18 ENCOUNTER — Emergency Department (HOSPITAL_COMMUNITY): Payer: Medicare Other

## 2023-11-18 ENCOUNTER — Encounter (HOSPITAL_COMMUNITY): Payer: Self-pay

## 2023-11-18 ENCOUNTER — Other Ambulatory Visit: Payer: Self-pay

## 2023-11-18 DIAGNOSIS — Z6833 Body mass index (BMI) 33.0-33.9, adult: Secondary | ICD-10-CM | POA: Diagnosis not present

## 2023-11-18 DIAGNOSIS — E7800 Pure hypercholesterolemia, unspecified: Secondary | ICD-10-CM | POA: Diagnosis present

## 2023-11-18 DIAGNOSIS — K259 Gastric ulcer, unspecified as acute or chronic, without hemorrhage or perforation: Secondary | ICD-10-CM | POA: Diagnosis not present

## 2023-11-18 DIAGNOSIS — Z825 Family history of asthma and other chronic lower respiratory diseases: Secondary | ICD-10-CM | POA: Diagnosis not present

## 2023-11-18 DIAGNOSIS — D500 Iron deficiency anemia secondary to blood loss (chronic): Secondary | ICD-10-CM | POA: Diagnosis present

## 2023-11-18 DIAGNOSIS — E119 Type 2 diabetes mellitus without complications: Secondary | ICD-10-CM | POA: Diagnosis present

## 2023-11-18 DIAGNOSIS — E669 Obesity, unspecified: Secondary | ICD-10-CM | POA: Diagnosis present

## 2023-11-18 DIAGNOSIS — K922 Gastrointestinal hemorrhage, unspecified: Secondary | ICD-10-CM | POA: Diagnosis present

## 2023-11-18 DIAGNOSIS — K254 Chronic or unspecified gastric ulcer with hemorrhage: Secondary | ICD-10-CM | POA: Diagnosis present

## 2023-11-18 DIAGNOSIS — K449 Diaphragmatic hernia without obstruction or gangrene: Secondary | ICD-10-CM | POA: Diagnosis present

## 2023-11-18 DIAGNOSIS — D509 Iron deficiency anemia, unspecified: Secondary | ICD-10-CM | POA: Diagnosis not present

## 2023-11-18 DIAGNOSIS — Z8619 Personal history of other infectious and parasitic diseases: Secondary | ICD-10-CM

## 2023-11-18 DIAGNOSIS — F17210 Nicotine dependence, cigarettes, uncomplicated: Secondary | ICD-10-CM | POA: Diagnosis present

## 2023-11-18 DIAGNOSIS — K257 Chronic gastric ulcer without hemorrhage or perforation: Secondary | ICD-10-CM

## 2023-11-18 DIAGNOSIS — R195 Other fecal abnormalities: Secondary | ICD-10-CM | POA: Diagnosis not present

## 2023-11-18 DIAGNOSIS — Z7982 Long term (current) use of aspirin: Secondary | ICD-10-CM | POA: Diagnosis not present

## 2023-11-18 DIAGNOSIS — Z800 Family history of malignant neoplasm of digestive organs: Secondary | ICD-10-CM

## 2023-11-18 DIAGNOSIS — E785 Hyperlipidemia, unspecified: Secondary | ICD-10-CM | POA: Diagnosis present

## 2023-11-18 DIAGNOSIS — K219 Gastro-esophageal reflux disease without esophagitis: Secondary | ICD-10-CM | POA: Diagnosis present

## 2023-11-18 DIAGNOSIS — Z8616 Personal history of COVID-19: Secondary | ICD-10-CM | POA: Diagnosis not present

## 2023-11-18 DIAGNOSIS — Z79899 Other long term (current) drug therapy: Secondary | ICD-10-CM

## 2023-11-18 DIAGNOSIS — I10 Essential (primary) hypertension: Secondary | ICD-10-CM | POA: Diagnosis present

## 2023-11-18 DIAGNOSIS — Z7984 Long term (current) use of oral hypoglycemic drugs: Secondary | ICD-10-CM | POA: Diagnosis not present

## 2023-11-18 DIAGNOSIS — R7989 Other specified abnormal findings of blood chemistry: Secondary | ICD-10-CM

## 2023-11-18 DIAGNOSIS — D649 Anemia, unspecified: Principal | ICD-10-CM

## 2023-11-18 DIAGNOSIS — Z885 Allergy status to narcotic agent status: Secondary | ICD-10-CM

## 2023-11-18 DIAGNOSIS — Z833 Family history of diabetes mellitus: Secondary | ICD-10-CM

## 2023-11-18 LAB — CBG MONITORING, ED: Glucose-Capillary: 104 mg/dL — ABNORMAL HIGH (ref 70–99)

## 2023-11-18 LAB — CBC WITH DIFFERENTIAL/PLATELET
Abs Immature Granulocytes: 0.25 10*3/uL — ABNORMAL HIGH (ref 0.00–0.07)
Basophils Absolute: 0.1 10*3/uL (ref 0.0–0.1)
Basophils Relative: 1 %
Eosinophils Absolute: 0.3 10*3/uL (ref 0.0–0.5)
Eosinophils Relative: 3 %
HCT: 20.9 % — ABNORMAL LOW (ref 36.0–46.0)
Hemoglobin: 5.9 g/dL — CL (ref 12.0–15.0)
Immature Granulocytes: 3 %
Lymphocytes Relative: 19 %
Lymphs Abs: 1.8 10*3/uL (ref 0.7–4.0)
MCH: 21.6 pg — ABNORMAL LOW (ref 26.0–34.0)
MCHC: 28.2 g/dL — ABNORMAL LOW (ref 30.0–36.0)
MCV: 76.6 fL — ABNORMAL LOW (ref 80.0–100.0)
Monocytes Absolute: 0.7 10*3/uL (ref 0.1–1.0)
Monocytes Relative: 8 %
Neutro Abs: 6.4 10*3/uL (ref 1.7–7.7)
Neutrophils Relative %: 66 %
Platelets: 305 10*3/uL (ref 150–400)
RBC: 2.73 MIL/uL — ABNORMAL LOW (ref 3.87–5.11)
RDW: 16.9 % — ABNORMAL HIGH (ref 11.5–15.5)
WBC: 9.4 10*3/uL (ref 4.0–10.5)
nRBC: 0.7 % — ABNORMAL HIGH (ref 0.0–0.2)

## 2023-11-18 LAB — IRON AND TIBC
Iron: 18 ug/dL — ABNORMAL LOW (ref 28–170)
Saturation Ratios: 4 % — ABNORMAL LOW (ref 10.4–31.8)
TIBC: 463 ug/dL — ABNORMAL HIGH (ref 250–450)
UIBC: 445 ug/dL

## 2023-11-18 LAB — COMPREHENSIVE METABOLIC PANEL
ALT: 14 U/L (ref 0–44)
AST: 14 U/L — ABNORMAL LOW (ref 15–41)
Albumin: 3.3 g/dL — ABNORMAL LOW (ref 3.5–5.0)
Alkaline Phosphatase: 73 U/L (ref 38–126)
Anion gap: 12 (ref 5–15)
BUN: 19 mg/dL (ref 8–23)
CO2: 21 mmol/L — ABNORMAL LOW (ref 22–32)
Calcium: 9.3 mg/dL (ref 8.9–10.3)
Chloride: 104 mmol/L (ref 98–111)
Creatinine, Ser: 0.78 mg/dL (ref 0.44–1.00)
GFR, Estimated: >60 mL/min (ref >60)
Glucose, Bld: 112 mg/dL — ABNORMAL HIGH (ref 70–99)
Potassium: 4.5 mmol/L (ref 3.5–5.1)
Sodium: 137 mmol/L (ref 135–145)
Total Bilirubin: 0.5 mg/dL (ref 0.0–1.2)
Total Protein: 6.5 g/dL (ref 6.5–8.1)

## 2023-11-18 LAB — POC OCCULT BLOOD, ED: Fecal Occult Bld: POSITIVE — AB

## 2023-11-18 LAB — GLUCOSE, CAPILLARY: Glucose-Capillary: 107 mg/dL — ABNORMAL HIGH (ref 70–99)

## 2023-11-18 MED ORDER — ACETAMINOPHEN 325 MG PO TABS: 650.0000 mg | ORAL_TABLET | Freq: Four times a day (QID) | ORAL | Status: DC | PRN

## 2023-11-18 MED ORDER — ONDANSETRON HCL 4 MG PO TABS: 4.0000 mg | ORAL_TABLET | Freq: Four times a day (QID) | ORAL | Status: DC | PRN

## 2023-11-18 MED ORDER — ONDANSETRON HCL 4 MG/2ML IJ SOLN: 4.0000 mg | Freq: Four times a day (QID) | INTRAMUSCULAR | Status: DC | PRN

## 2023-11-18 MED ORDER — ALBUTEROL SULFATE (2.5 MG/3ML) 0.083% IN NEBU: 2.5000 mg | INHALATION_SOLUTION | RESPIRATORY_TRACT | Status: DC | PRN

## 2023-11-18 MED ORDER — SODIUM CHLORIDE 0.9 % IV SOLN
125.0000 mg | Freq: Every day | INTRAVENOUS | Status: DC
  Filled 2023-11-18: qty 10

## 2023-11-18 MED ORDER — ATORVASTATIN CALCIUM 10 MG PO TABS
20.0000 mg | ORAL_TABLET | Freq: Every day | ORAL | Status: DC
  Administered 2023-11-18 – 2023-11-19 (×2): 20 mg via ORAL
  Filled 2023-11-18 (×2): qty 2

## 2023-11-18 MED ORDER — SODIUM CHLORIDE 0.9 % IV SOLN: INTRAVENOUS | Status: AC

## 2023-11-18 MED ORDER — PANTOPRAZOLE SODIUM 40 MG IV SOLR
40.0000 mg | Freq: Two times a day (BID) | INTRAVENOUS | Status: DC
  Administered 2023-11-19 – 2023-11-20 (×3): 40 mg via INTRAVENOUS
  Filled 2023-11-18 (×3): qty 10

## 2023-11-18 MED ORDER — HYDRALAZINE HCL 20 MG/ML IJ SOLN: 5.0000 mg | Freq: Four times a day (QID) | INTRAMUSCULAR | Status: DC | PRN

## 2023-11-18 MED ORDER — INSULIN ASPART 100 UNIT/ML IJ SOLN: 0.0000 [IU] | Freq: Three times a day (TID) | INTRAMUSCULAR | Status: DC

## 2023-11-18 MED ORDER — ACETAMINOPHEN 650 MG RE SUPP: 650.0000 mg | Freq: Four times a day (QID) | RECTAL | Status: DC | PRN

## 2023-11-18 MED ORDER — OXYCODONE HCL 5 MG PO TABS: 5.0000 mg | ORAL_TABLET | ORAL | Status: DC | PRN

## 2023-11-18 NOTE — ED Notes (Signed)
Pt alert and oriented. Denies pain. No needs at this time. Family at bedside. Call light within reach, bed locked and in lowest position.

## 2023-11-18 NOTE — H&P (Signed)
History and Physical    Patient: Kelly Rios UEA:[id number] DOB: 08-24-1951 DOA: 11/18/2023 DOS: the patient was seen and examined on 11/18/2023 PCP: Benetta Spar, MD  Patient coming from: Home  Chief Complaint: Weakness, fatigue, abnormal labs Chief Complaint  Patient presents with   Abnormal Lab   HPI: Kelly Rios is a 73 y.o. female Jehovah's Witness with medical history significant of HTN, HLD, iron deficiency anemia, type 2 diabetes mellitus, GERD who presented to [facility] ED on 1/31 by direction of her PCP for abnormal labs.  Patient reports has been feeling increased fatigue, weakness over the last 2-3 months.  Was seen by her PCP; labs performed with noted low hemoglobin and was sent to the ED for further evaluation.  Patient does endorse daily aspirin use but denies any oral NSAIDs (uses Voltaren gel occasionally), other antiplatelets or anticoagulant use.  Patient does endorse intermittent dark stools but thought this was due to her iron use.  She does also endorse dizziness on ambulation but no episodes of syncope or presyncope.  Denies headache, no visual changes, no chest pain, no palpitations, no shortness of breath, no abdominal pain, no fever/chills/night sweats, no nausea/vomiting/diarrhea, no focal weakness, no paresthesias.    In the ED, temperature 97.7 F, HR 84, RR 20, BP 158/72, SpO2 98% on room air.  WBC 9.4, hemoglobin 5.9, platelet count 305, MCV 76.6.  Sodium 137 potassium 4.5, chloride 104, CO2 21, glucose 112, BUN 19, creatinine 0.78.  AST 14, ALT 14, total bilirubin 0.5.  Anemia panel iron 18, TIBC high 463.  FOBT positive.  Chest x-ray with no acute cardiopulmonary disease process.  GI was consulted, anesthesia uncomfortable with performing endoscopy at independent and recommended transfer to [facility] per EDP.  TRH consulted for admission.  Review of Systems: As mentioned in the history of present illness. All other systems reviewed and  are negative. Past Medical History:  Diagnosis Date   Anemia    COVID-19 virus infection 06/2020   Diverticulitis    GERD (gastroesophageal reflux disease)    High cholesterol    HTN (hypertension)    Past Surgical History:  Procedure Laterality Date   ABDOMINAL HYSTERECTOMY     CHOLECYSTECTOMY     COLONOSCOPY  08/25/2012   Procedure: COLONOSCOPY;  Surgeon: West Bali, MD;  Location: [facility];  Service: Endoscopy;  Laterality: N/A;  12:30-changed to 12:45 Soledad Gerlach notified   COLONOSCOPY WITH PROPOFOL N/A 12/22/2021   Procedure: COLONOSCOPY WITH PROPOFOL;  Surgeon: Lanelle Bal, DO;  Location: [facility];  Service: Endoscopy;  Laterality: N/A;  12:00 / ASA 2   ENDOVENOUS ABLATION SAPHENOUS VEIN W/ LASER Right 06/28/2018   endovenous laser ablation R GSV by Fabienne Bruns MD   ENDOVENOUS ABLATION SAPHENOUS VEIN W/ LASER Left 07/19/2018   endovenous laser ablation Left greater saphenous vein by Fabienne Bruns MD    ESOPHAGOGASTRODUODENOSCOPY  08/25/2012   Procedure: ESOPHAGOGASTRODUODENOSCOPY (EGD);  Surgeon: West Bali, MD;  Location: [facility];  Service: Endoscopy;  Laterality: N/A;   LASER ABLATION Right 07/28/2023   Laser ablation of the Right greater saphenous vein with >20 stab phlebectomies   PARTIAL HYSTERECTOMY     stab phlebectomy Left 04/25/2019   stab phlebectomy 10-20 incisions left leg by Fabienne Bruns MD    stab phlebectomy Right 05/23/2019   stab phlebectomy 10-20 incisions right leg by Fabienne Bruns MD    Social History:  reports that she has quit  smoking. Her smoking use included cigarettes. She has a 7.5 pack-year smoking history. She has never used smokeless tobacco. She reports that she does not drink alcohol and does not use drugs.  Allergies  Allergen Reactions   Codeine Shortness Of Breath    Couldn't breath    Family History  Problem Relation Age of Onset   Asthma Other    Diabetes Other    Colon cancer Maternal Grandmother         67s   Liver disease Neg Hx     Prior to Admission medications   Medication Sig Start Date End Date Taking? Authorizing Provider  alum & mag hydroxide-simeth (MAALOX/MYLANTA) 200-200-20 MG/5ML suspension Take 15 mLs by mouth every 6 (six) hours as needed (diverticulitis flare).    [provider]  aspirin EC 81 MG tablet Take 81 mg by mouth daily. Swallow whole.    [provider]  atorvastatin (LIPITOR) 20 MG tablet Take 20 mg by mouth at bedtime. 05/14/19   [provider]  bisoprolol-hydrochlorothiazide (ZIAC) 10-6.25 MG per tablet Take 2 tablets by mouth daily.    [provider]  Calcium Carb-Cholecalciferol (CALCIUM PLUS VITAMIN D3) 600-12.5 MG-MCG CAPS Take 1 tablet by mouth every other day.    [provider]  cholecalciferol (VITAMIN D3) 25 MCG (1000 UNIT) tablet Take 2,000 Units by mouth daily.    [provider]  ciprofloxacin (CIPRO) 500 MG tablet Take 500 mg by mouth 2 (two) times daily as needed (diverticulitis flare). Takes for 10 days when she has flare up. 05/08/18   [provider]  ferrous sulfate 325 (65 FE) MG tablet Take 325 mg by mouth every other day.    [provider]  losartan (COZAAR) 100 MG tablet Take 100 mg by mouth daily.    [provider]  Magnesium 250 MG TABS Take 250 mg by mouth every other day.    [provider]  metFORMIN (GLUCOPHAGE-XR) 500 MG 24 hr tablet Take 500 mg by mouth daily at 6 (six) AM. 06/26/19   [provider]  metroNIDAZOLE (FLAGYL) 500 MG tablet Take 500 mg by mouth 2 (two) times daily as needed (diverticulitis flare). Takes for 10 days with flare ups. 12/24/16   [provider]  NYSTATIN powder Apply 1 application topically daily. 07/20/21   [provider]  pantoprazole (PROTONIX) 40 MG tablet Take 40 mg by mouth 2 (two) times daily as needed (acid reflux).    [provider]  potassium chloride SA (K-DUR,KLOR-CON)  20 MEQ tablet Take 20 mEq by mouth 2 (two) times daily. 01/23/17   [provider]    Physical Exam: Vitals:   11/18/23 1315 11/18/23 1330 11/18/23 1345 11/18/23 1400  BP: (!) 155/68 (!) 135/59 (!) 141/64 139/64  Pulse: 68 62 70 70  Resp:    18  Temp:    97.9 F (36.6 C)  TempSrc:    Oral  SpO2: 100% 100% 92% 100%  Weight:      Height:       GEN: 73 yo female in NAD, alert and oriented x 3, wd/wn; pale in complexion HEENT: NCAT, PERRL, EOMI, sclera clear, pale conjunctiva, MMM PULM: CTAB w/o wheezes/crackles, normal respiratory effort, on room air CV: RRR w/o M/G/R GI: abd soft, NTND, NABS, no R/G/M MSK: no peripheral edema, muscle strength globally intact 5/5 bilateral upper/lower extremities NEURO: CN II-XII intact, no focal deficits, sensation to light touch intact PSYCH: normal mood/affect Integumentary: dry/intact, no rashes  or wounds   Assessment and Plan:   Symptomatic iron deficiency anemia concerning for upper GI bleed Patient presenting to ED by discretion of her PCP after abnormal hemoglobin drawn outpatient.  Patient reports experiencing increasing fatigue, weakness over the last several months.  Does endorse intermittent dark tarry stools; but attributed to her oral iron use.  Hemoglobin on admission 5.9.  FOBT positive.  Complicated by her Jehovah's Witness status, declining blood transfusions.  Patient is hemodynamically stable.  EDP consulted GI at [facility] who stated anesthesia uncomfortable and wishes patient transferred to [facility] for further management with likely need of endoscopy. -- Admit to [facility], progressive unit -- Holding home aspirin -- IV iron daily x 4 (patient consented to infusion) -- Protonix 40 mg IV every 12 hours -- Clear liquid diet, n.p.o. after midnight -- Repeat CBC in a.m. (tried to limit lab draws given her blood product refusal) -- Monitor on telemetry -- Secure chat sent to [facility], Dr. Myrtie Neither and Doug Sou, PA  regarding transfer  Essential hypertension Blood pressure 139/64.  At baseline on bisoprolol-HCTZ 20-12.5 mg p.o. daily, losartan 100 mg p.o. daily. -- Hold home antihypertensives for now -- Hydralazine 5mg  IV q6h PRN SBP >165  Hyperlipidemia -- Continue atorvastatin  Type 2 diabetes mellitus On metformin 500 mg p.o. daily at baseline. -- Very sensitive SSI for coverage -- CBGs qAC/HS  GERD -- On IV Protonix as above  Jehovah's Witness Patient's refusing blood products but agreeable for IV iron.    Advance Care Planning:   Code Status: Full Code   Consults: [facility], GI, [place] [facility] (Dr. Myrtie Neither)  Family Communication: Updated family present at bedside  Severity of Illness: The appropriate patient status for this patient is INPATIENT. Inpatient status is judged to be reasonable and necessary in order to provide the required intensity of service to ensure the patient's safety. The patient's presenting symptoms, physical exam findings, and initial radiographic and laboratory data in the context of their chronic comorbidities is felt to place them at high risk for further clinical deterioration. Furthermore, it is not anticipated that the patient will be medically stable for discharge from the hospital within 2 midnights of admission.   * I certify that at the point of admission it is my clinical judgment that the patient will require inpatient hospital care spanning beyond 2 midnights from the point of admission due to high intensity of service, high risk for further deterioration and high frequency of surveillance required.*  Author: Alvira Philips [place], DO 11/18/2023 2:34 PM  For on call review [email or web address].

## 2023-11-18 NOTE — ED Notes (Signed)
 [facility] called for transport.

## 2023-11-18 NOTE — ED Provider Notes (Signed)
[place] EMERGENCY DEPARTMENT AT [facility] Provider Note   CSN: [id number] Arrival date & time: 11/18/23  1005     History  Chief Complaint  Patient presents with   Abnormal Lab    Kelly Rios is a 73 y.o. female, history of iron deficiency anemia, prediabetes, who presents to the ED secondary to extreme fatigue, this been going on for the last few months, as well as abnormal blood work.  She states that she was told by her primary care doctor, to come into the ER, because her B12 levels were greater than 2000, and her hemoglobin was 5.  She denies any kind of black stools, no abdominal pain, but has had a little bit of shortness of breath and increased fatigue.  She states that when she walks, she feels like she is going to pass out because she feels so weak.  Denies any chest pain however.  Notes that she does have a history of diverticulitis, but has not had any flares recently.  Is not on any blood thinners.  Has been taking B12, at home, to help with her symptoms, without any relief.  Notes she has not been taking her iron pills, because she it causes some constipation.  Denies any numbness, tingling, headache, difficulty sleeping, or memory loss.  She reports she is a Jehovah witness, and refuses any blood products, but will accept an iron transfusion.  Home Medications Prior to Admission medications   Medication Sig Start Date End Date Taking? Authorizing Provider  alum & mag hydroxide-simeth (MAALOX/MYLANTA) 200-200-20 MG/5ML suspension Take 15 mLs by mouth every 6 (six) hours as needed (diverticulitis flare).    [provider]  aspirin EC 81 MG tablet Take 81 mg by mouth daily. Swallow whole.    [provider]  atorvastatin (LIPITOR) 20 MG tablet Take 20 mg by mouth at bedtime. 05/14/19   [provider]  bisoprolol-hydrochlorothiazide (ZIAC) 10-6.25 MG per tablet Take 2 tablets by mouth daily.    [provider]  Calcium  Carb-Cholecalciferol (CALCIUM PLUS VITAMIN D3) 600-12.5 MG-MCG CAPS Take 1 tablet by mouth every other day.    [provider]  cholecalciferol (VITAMIN D3) 25 MCG (1000 UNIT) tablet Take 2,000 Units by mouth daily.    [provider]  ciprofloxacin (CIPRO) 500 MG tablet Take 500 mg by mouth 2 (two) times daily as needed (diverticulitis flare). Takes for 10 days when she has flare up. 05/08/18   [provider]  ferrous sulfate 325 (65 FE) MG tablet Take 325 mg by mouth every other day.    [provider]  losartan (COZAAR) 100 MG tablet Take 100 mg by mouth daily.    [provider]  Magnesium 250 MG TABS Take 250 mg by mouth every other day.    [provider]  metFORMIN (GLUCOPHAGE-XR) 500 MG 24 hr tablet Take 500 mg by mouth daily at 6 (six) AM. 06/26/19   [provider]  metroNIDAZOLE (FLAGYL) 500 MG tablet Take 500 mg by mouth 2 (two) times daily as needed (diverticulitis flare). Takes for 10 days with flare ups. 12/24/16   [provider]  NYSTATIN powder Apply 1 application topically daily. 07/20/21   [provider]  pantoprazole (PROTONIX) 40 MG tablet Take 40 mg by mouth 2 (two) times daily as needed (acid reflux).    [provider]  potassium chloride SA (K-DUR,KLOR-CON) 20 MEQ tablet Take 20 mEq by mouth 2 (two) times daily. 01/23/17  [provider]      Allergies    Codeine    Review of Systems   Review of Systems  Constitutional:  Positive for fatigue.  Respiratory:  Positive for shortness of breath.   Gastrointestinal:  Negative for abdominal pain and blood in stool.    Physical Exam Updated Vital Signs BP 139/64 (BP Location: Left Arm)   Pulse 70   Temp 97.9 F (36.6 C) (Oral)   Resp 18   Ht 5\' 6"  (1.676 m)   Wt 94.3 kg   SpO2 100%   BMI 33.55 kg/m  Physical Exam Vitals and nursing note reviewed.  Constitutional:      General: She is not in acute distress.     Appearance: She is well-developed.     Comments: +pale appearing  HENT:     Head: Normocephalic and atraumatic.  Eyes:     Conjunctiva/sclera: Conjunctivae normal.  Cardiovascular:     Rate and Rhythm: Normal rate and regular rhythm.     Heart sounds: No murmur heard. Pulmonary:     Effort: Pulmonary effort is normal. No respiratory distress.     Breath sounds: Normal breath sounds.  Abdominal:     Palpations: Abdomen is soft.     Tenderness: There is no abdominal tenderness.  Musculoskeletal:        General: No swelling.     Cervical back: Neck supple.  Skin:    General: Skin is warm and dry.     Capillary Refill: Capillary refill takes less than 2 seconds.  Neurological:     Mental Status: She is alert.  Psychiatric:        Mood and Affect: Mood normal.     ED Results / Procedures / Treatments   Labs (all labs ordered are listed, but only abnormal results are displayed) Labs Reviewed  CBC WITH DIFFERENTIAL/PLATELET - Abnormal; Notable for the following components:      Result Value   RBC 2.73 (*)    Hemoglobin 5.9 (*)    HCT 20.9 (*)    MCV 76.6 (*)    MCH 21.6 (*)    MCHC 28.2 (*)    RDW 16.9 (*)    nRBC 0.7 (*)    Abs Immature Granulocytes 0.25 (*)    All other components within normal limits  COMPREHENSIVE METABOLIC PANEL - Abnormal; Notable for the following components:   CO2 21 (*)    Glucose, Bld 112 (*)    Albumin 3.3 (*)    AST 14 (*)    All other components within normal limits  IRON AND TIBC - Abnormal; Notable for the following components:   Iron 18 (*)    TIBC 463 (*)    Saturation Ratios 4 (*)    All other components within normal limits  POC OCCULT BLOOD, ED - Abnormal; Notable for the following components:   Fecal Occult Bld POSITIVE (*)    All other components within normal limits  CBG MONITORING, ED - Abnormal; Notable for the following components:   Glucose-Capillary 104 (*)    All other components within normal limits  OCCULT BLOOD X 1  CARD TO LAB, STOOL    EKG None  Radiology DG Chest Portable 1 View Result Date: 11/18/2023 CLINICAL DATA:  Shortness of breath and dizziness. EXAM: PORTABLE CHEST 1 VIEW COMPARISON:  None Available. FINDINGS: Large hiatal hernia. Normal cardiopericardial silhouette otherwise. No consolidation, pneumothorax or effusion. No edema. Degenerative changes of the spine. Artifact from the patient's  clothing. IMPRESSION: Hiatal hernia.  No acute cardiopulmonary disease. Electronically Signed   By: Karen Kays M.D.   On: 11/18/2023 11:11    Procedures Procedures    Medications Ordered in ED Medications - No data to display  ED Course/ Medical Decision Making/ A&P                                 Medical Decision Making Patient is a 73 year old female, here for high B12 levels, and low hemoglobin, sent in by her PCPs office.  She denies any blood in her stool, states she has not been taking her iron as instructed.  She states it makes her too constipated.  She is pale looking on exam, however not in any distress.  She has no abdominal tenderness to palpation.  We will do a Hemoccult, given her low hemoglobin, as well as iron studies, given history of iron deficiency.  Patient has no paresthesias, headaches, overall reassuring.  Informed her to stop taking her B12 supplementation  Amount and/or Complexity of Data Reviewed Labs: ordered.    Details: Hemoglobin of 5.9 Radiology: ordered.    Details: Chest x-ray clear Discussion of management or test interpretation with external provider(s): Discussed with patient, her Hemoccult is positive, hemoglobin is 5.9, she refuses any blood products, however will accept iron.  Spoke with Dr. Marletta Lor, given patient is Jehovah's Witness, refuses blood products, and he has hemoglobin 5.9, will likely need admission, with likely endoscopy.  He agrees, however spoke with anesthesiology, and states that the patient will need to be transferred to [facility].  I discussed  this with the patient who is in agreement with plan.  I believe that this is likely the etiology of her shortness of breath, and I believe that this is likely a chronic bleed, however given Jehovah's Witness status,/not willing to accept breath blood products, will need further evaluation, and management. Admitted to Dr. [place], hospitalist  Risk Decision regarding hospitalization.    Final Clinical Impression(s) / ED Diagnoses Final diagnoses:  Symptomatic anemia  Gastrointestinal hemorrhage, unspecified gastrointestinal hemorrhage type  High serum vitamin B12    Rx / DC Orders ED Discharge Orders     None         Pete Pelt, PA 11/18/23 1421    Loetta Rough, MD 11/19/23 [phone]

## 2023-11-18 NOTE — ED Notes (Signed)
ED TO INPATIENT HANDOFF REPORT  ED Nurse Name and Phone #: Larinda Buttery, [phone]  S Name/Age/Gender [name] 73 y.o. female Room/Bed: APA04/APA04  Code Status   Code Status: Full Code  Home/SNF/Other Home Patient oriented to: self, place, time, and situation Is this baseline? Yes   Triage Complete: Triage complete  Chief Complaint UGIB (upper gastrointestinal bleed) [K92.2]  Triage Note Pt arrived via POV c/o abnormal labs. Pt reports she was called by her PCP and informed her B12 > 2000. Pt was informed she needed to come to the ER for a transfusion, but Pt reports she is a [employer] and will only accept an Iron Transfusion.    Allergies Allergies  Allergen Reactions   Codeine Shortness Of Breath    Couldn't breath Nothing similar to codeine, or derivatives     Level of Care/Admitting Diagnosis ED Disposition     ED Disposition  Admit   Condition  --   Comment  Hospital Area: [facility] [[id number]]  Level of Care: Progressive [102]  Admit to Progressive based on following criteria: GI, ENDOCRINE disease patients with GI bleeding, acute liver failure or pancreatitis, stable with diabetic ketoacidosis or thyrotoxicosis (hypothyroid) state.  May admit patient to [facility] or [facility] if equivalent level of care is available:: No  Covid Evaluation: Asymptomatic - no recent exposure (last 10 days) testing not required  Diagnosis: UGIB (upper gastrointestinal bleed) [829562]  Admitting Physician: [place], ERIC J [[id number]]  Attending Physician: [place], ERIC J [[id number]]  Certification:: I certify this patient will need inpatient services for at least 2 midnights  Expected Medical Readiness: 11/21/2023          B Medical/Surgery History Past Medical History:  Diagnosis Date   Anemia    COVID-19 virus infection 06/2020   Diverticulitis    GERD (gastroesophageal reflux disease)    High cholesterol    HTN (hypertension)     Past Surgical History:  Procedure Laterality Date   ABDOMINAL HYSTERECTOMY     CHOLECYSTECTOMY     COLONOSCOPY  08/25/2012   Procedure: COLONOSCOPY;  Surgeon: West Bali, MD;  [facility];  Service: Endoscopy;  Laterality: N/A;  12:30-changed to 12:45 Soledad Gerlach notified   COLONOSCOPY WITH PROPOFOL N/A 12/22/2021   Procedure: COLONOSCOPY WITH PROPOFOL;  Surgeon: Lanelle Bal, DO;  Location: [facility];  Service: Endoscopy;  Laterality: N/A;  12:00 / ASA 2   ENDOVENOUS ABLATION SAPHENOUS VEIN W/ LASER Right 06/28/2018   endovenous laser ablation R GSV by Fabienne Bruns MD   ENDOVENOUS ABLATION SAPHENOUS VEIN W/ LASER Left 07/19/2018   endovenous laser ablation Left greater saphenous vein by Fabienne Bruns MD    ESOPHAGOGASTRODUODENOSCOPY  08/25/2012   Procedure: ESOPHAGOGASTRODUODENOSCOPY (EGD);  Surgeon: West Bali, MD;  Location[facility];  Service: Endoscopy;  Laterality: N/A;   LASER ABLATION Right 07/28/2023   Laser ablation of the Right greater saphenous vein with >20 stab phlebectomies   PARTIAL HYSTERECTOMY     stab phlebectomy Left 04/25/2019   stab phlebectomy 10-20 incisions left leg by Fabienne Bruns MD    stab phlebectomy Right 05/23/2019   stab phlebectomy 10-20 incisions right leg by Fabienne Bruns MD      A IV Location/Drains/Wounds Patient Lines/Drains/Airways Status     Active Line/Drains/Airways     Name Placement date Placement time Site Days   Peripheral IV 11/18/23 Anterior;Proximal;Right Forearm 11/18/23  1500  Forearm  less than 1  Intake/Output Last 24 hours  Intake/Output Summary (Last 24 hours) at 11/18/2023 1954 Last data filed at 11/18/2023 1620 Gross per 24 hour  Intake 240 ml  Output --  Net 240 ml    Labs/Imaging Results for orders placed or performed during the hospital encounter of 11/18/23 (from the past 48 hours)  CBC with Differential     Status: Abnormal   Collection Time: 11/18/23  12:03 PM  Result Value Ref Range   WBC 9.4 4.0 - 10.5 K/uL   RBC 2.73 (L) 3.87 - 5.11 MIL/uL   Hemoglobin 5.9 (LL) 12.0 - 15.0 g/dL    Comment: REPEATED TO VERIFY Reticulocyte Hemoglobin testing may be clinically indicated, consider ordering this additional test [id number] THIS CRITICAL RESULT HAS VERIFIED AND BEEN CALLED TO EMILY G. BY TIASIA HAMER ON 01 31 2025 AT 1217, AND HAS BEEN READ BACK.     HCT 20.9 (L) 36.0 - 46.0 %   MCV 76.6 (L) 80.0 - 100.0 fL   MCH 21.6 (L) 26.0 - 34.0 pg   MCHC 28.2 (L) 30.0 - 36.0 g/dL   RDW 50.9 (H) 32.6 - 71.2 %   Platelets 305 150 - 400 K/uL   nRBC 0.7 (H) 0.0 - 0.2 %   Neutrophils Relative % 66 %   Neutro Abs 6.4 1.7 - 7.7 K/uL   Lymphocytes Relative 19 %   Lymphs Abs 1.8 0.7 - 4.0 K/uL   Monocytes Relative 8 %   Monocytes Absolute 0.7 0.1 - 1.0 K/uL   Eosinophils Relative 3 %   Eosinophils Absolute 0.3 0.0 - 0.5 K/uL   Basophils Relative 1 %   Basophils Absolute 0.1 0.0 - 0.1 K/uL   Immature Granulocytes 3 %   Abs Immature Granulocytes 0.25 (H) 0.00 - 0.07 K/uL    Comment: Performed at [facility], [address], [place], [place] [address]  Comprehensive metabolic panel     Status: Abnormal   Collection Time: 11/18/23 12:03 PM  Result Value Ref Range   Sodium 137 135 - 145 mmol/L   Potassium 4.5 3.5 - 5.1 mmol/L   Chloride 104 98 - 111 mmol/L   CO2 21 (L) 22 - 32 mmol/L   Glucose, Bld 112 (H) 70 - 99 mg/dL    Comment: Glucose reference range applies only to samples taken after fasting for at least 8 hours.   BUN 19 8 - 23 mg/dL   Creatinine, Ser 9.83 0.44 - 1.00 mg/dL   Calcium 9.3 8.9 - 38.2 mg/dL   Total Protein 6.5 6.5 - 8.1 g/dL   Albumin 3.3 (L) 3.5 - 5.0 g/dL   AST 14 (L) 15 - 41 U/L   ALT 14 0 - 44 U/L   Alkaline Phosphatase 73 38 - 126 U/L   Total Bilirubin 0.5 0.0 - 1.2 mg/dL   GFR, Estimated >50 >53 mL/min    Comment: (NOTE) Calculated using the CKD-EPI Creatinine Equation (2021)    Anion gap 12 5 - 15    Comment:  Performed at [facility], [address], [place], [place] [address]  Iron and TIBC     Status: Abnormal   Collection Time: 11/18/23 12:03 PM  Result Value Ref Range   Iron 18 (L) 28 - 170 ug/dL   TIBC 419 (H) 379 - 024 ug/dL   Saturation Ratios 4 (L) 10.4 - 31.8 %   UIBC 445 ug/dL    Comment: Performed at [facility], [address] [place], [place] [address]  POC occult  blood, ED     Status: Abnormal   Collection Time: 11/18/23 12:27 PM  Result Value Ref Range   Fecal Occult Bld POSITIVE (A) NEGATIVE  CBG monitoring, ED     Status: Abnormal   Collection Time: 11/18/23  1:11 PM  Result Value Ref Range   Glucose-Capillary 104 (H) 70 - 99 mg/dL    Comment: Glucose reference range applies only to samples taken after fasting for at least 8 hours.   DG Chest Portable 1 View Result Date: 11/18/2023 CLINICAL DATA:  Shortness of breath and dizziness. EXAM: PORTABLE CHEST 1 VIEW COMPARISON:  None Available. FINDINGS: Large hiatal hernia. Normal cardiopericardial silhouette otherwise. No consolidation, pneumothorax or effusion. No edema. Degenerative changes of the spine. Artifact from the patient's clothing. IMPRESSION: Hiatal hernia.  No acute cardiopulmonary disease. Electronically Signed   By: Karen Kays M.D.   On: 11/18/2023 11:11    Pending Labs Unresulted Labs (From admission, onward)     Start     Ordered   11/18/23 1040  Occult blood card to lab, stool  Once,   URGENT        11/18/23 1039   Signed and Held  CBC  Tomorrow morning,   R        Signed and Held   Signed and Held  Basic metabolic panel  Tomorrow morning,   R        Signed and Held   Signed and Held  Protime-INR  Tomorrow morning,   R        Signed and Held   Signed and Held  TSH  Tomorrow morning,   R        Signed and Held   Signed and Held  Hemoglobin A1c  Tomorrow morning,   R        Signed and Held            Vitals/Pain Today's Vitals   11/18/23 1815 11/18/23 1830 11/18/23 1845 11/18/23 1900  BP:  (!) 144/59 (!) 143/61 (!) 141/58 (!) 136/50  Pulse: 75 75 77 75  Resp:      Temp:      TempSrc:      SpO2: 98% 99% 97% 97%  Weight:      Height:      PainSc:        Isolation Precautions No active isolations  Medications Medications - No data to display  Mobility walks with person assist     Focused Assessments    R Recommendations: See Admitting Provider Note  Report given to:   Additional Notes:

## 2023-11-18 NOTE — ED Triage Notes (Signed)
Pt arrived via POV c/o abnormal labs. Pt reports she was called by her PCP and informed her B12 > 2000. Pt was informed she needed to come to the ER for a transfusion, but Pt reports she is a [employer] and will only accept an Iron Transfusion.

## 2023-11-18 NOTE — ED Notes (Signed)
 Pt ambulated to the restroom.

## 2023-11-19 ENCOUNTER — Encounter (HOSPITAL_COMMUNITY)
Admission: EM | Disposition: A | Discharge status: Home / Self Care | Payer: Self-pay | Source: Home / Self Care | Attending: Internal Medicine

## 2023-11-19 ENCOUNTER — Inpatient Hospital Stay (HOSPITAL_COMMUNITY): Payer: Medicare Other | Admitting: Certified Registered Nurse Anesthetist

## 2023-11-19 ENCOUNTER — Encounter (HOSPITAL_COMMUNITY): Payer: Self-pay | Admitting: Internal Medicine

## 2023-11-19 DIAGNOSIS — K922 Gastrointestinal hemorrhage, unspecified: Secondary | ICD-10-CM | POA: Diagnosis not present

## 2023-11-19 DIAGNOSIS — K259 Gastric ulcer, unspecified as acute or chronic, without hemorrhage or perforation: Secondary | ICD-10-CM

## 2023-11-19 DIAGNOSIS — D509 Iron deficiency anemia, unspecified: Secondary | ICD-10-CM

## 2023-11-19 DIAGNOSIS — R195 Other fecal abnormalities: Secondary | ICD-10-CM | POA: Diagnosis not present

## 2023-11-19 DIAGNOSIS — K257 Chronic gastric ulcer without hemorrhage or perforation: Secondary | ICD-10-CM

## 2023-11-19 DIAGNOSIS — K449 Diaphragmatic hernia without obstruction or gangrene: Secondary | ICD-10-CM

## 2023-11-19 DIAGNOSIS — D500 Iron deficiency anemia secondary to blood loss (chronic): Secondary | ICD-10-CM | POA: Diagnosis not present

## 2023-11-19 HISTORY — PX: ESOPHAGOGASTRODUODENOSCOPY (EGD) WITH PROPOFOL: SHX5813

## 2023-11-19 LAB — BASIC METABOLIC PANEL
Anion gap: 10 (ref 5–15)
BUN: 11 mg/dL (ref 8–23)
CO2: 23 mmol/L (ref 22–32)
Calcium: 9.2 mg/dL (ref 8.9–10.3)
Chloride: 106 mmol/L (ref 98–111)
Creatinine, Ser: 0.82 mg/dL (ref 0.44–1.00)
GFR, Estimated: >60 mL/min (ref >60)
Glucose, Bld: 127 mg/dL — ABNORMAL HIGH (ref 70–99)
Potassium: 4 mmol/L (ref 3.5–5.1)
Sodium: 139 mmol/L (ref 135–145)

## 2023-11-19 LAB — CBC
HCT: 20.4 % — ABNORMAL LOW (ref 36.0–46.0)
Hemoglobin: 5.7 g/dL — CL (ref 12.0–15.0)
MCH: 20.7 pg — ABNORMAL LOW (ref 26.0–34.0)
MCHC: 27.9 g/dL — ABNORMAL LOW (ref 30.0–36.0)
MCV: 74.2 fL — ABNORMAL LOW (ref 80.0–100.0)
Platelets: 314 10*3/uL (ref 150–400)
RBC: 2.75 MIL/uL — ABNORMAL LOW (ref 3.87–5.11)
RDW: 17.1 % — ABNORMAL HIGH (ref 11.5–15.5)
WBC: 7.8 10*3/uL (ref 4.0–10.5)
nRBC: 0.4 % — ABNORMAL HIGH (ref 0.0–0.2)

## 2023-11-19 LAB — PROTIME-INR
INR: 1.1 (ref 0.8–1.2)
Prothrombin Time: 14.2 s (ref 11.4–15.2)

## 2023-11-19 LAB — TSH: TSH: 1.448 u[IU]/mL (ref 0.350–4.500)

## 2023-11-19 LAB — HEMOGLOBIN A1C
Hgb A1c MFr Bld: 7 % — ABNORMAL HIGH (ref 4.8–5.6)
Mean Plasma Glucose: 154.2 mg/dL

## 2023-11-19 LAB — GLUCOSE, CAPILLARY
Glucose-Capillary: 122 mg/dL — ABNORMAL HIGH (ref 70–99)
Glucose-Capillary: 113 mg/dL — ABNORMAL HIGH (ref 70–99)
Glucose-Capillary: 152 mg/dL — ABNORMAL HIGH (ref 70–99)
Glucose-Capillary: 99 mg/dL (ref 70–99)
Glucose-Capillary: 168 mg/dL — ABNORMAL HIGH (ref 70–99)

## 2023-11-19 SURGERY — ESOPHAGOGASTRODUODENOSCOPY (EGD) WITH PROPOFOL
Anesthesia: Monitor Anesthesia Care

## 2023-11-19 MED ORDER — HYDRALAZINE HCL 20 MG/ML IJ SOLN: 10.0000 mg | Freq: Three times a day (TID) | INTRAMUSCULAR | Status: DC | PRN

## 2023-11-19 MED ORDER — PROPOFOL 500 MG/50ML IV EMUL
INTRAVENOUS | Status: DC | PRN
  Administered 2023-11-19: 150 ug/kg/min via INTRAVENOUS

## 2023-11-19 MED ORDER — MIDAZOLAM HCL 2 MG/2ML IJ SOLN
INTRAMUSCULAR | Status: AC
  Filled 2023-11-19: qty 2

## 2023-11-19 MED ORDER — SODIUM CHLORIDE 0.9 % IV SOLN: INTRAVENOUS | Status: DC | PRN

## 2023-11-19 MED ORDER — IRON SUCROSE 500 MG IVPB - SIMPLE MED: 500.0000 mg | Freq: Once | INTRAVENOUS | Status: DC

## 2023-11-19 MED ORDER — KETAMINE HCL 50 MG/5ML IJ SOSY
PREFILLED_SYRINGE | INTRAMUSCULAR | Status: AC
  Filled 2023-11-19: qty 5

## 2023-11-19 MED ORDER — MIDAZOLAM HCL 2 MG/2ML IJ SOLN
INTRAMUSCULAR | Status: DC | PRN
  Administered 2023-11-19: 2 mg via INTRAVENOUS

## 2023-11-19 MED ORDER — ONDANSETRON HCL 4 MG/2ML IJ SOLN
INTRAMUSCULAR | Status: DC | PRN
  Administered 2023-11-19: 4 mg via INTRAVENOUS

## 2023-11-19 MED ORDER — KETAMINE HCL 10 MG/ML IJ SOLN
INTRAMUSCULAR | Status: DC | PRN
  Administered 2023-11-19: 30 mg via INTRAVENOUS

## 2023-11-19 MED ORDER — LIDOCAINE 2% (20 MG/ML) 5 ML SYRINGE
INTRAMUSCULAR | Status: DC | PRN
  Administered 2023-11-19: 100 mg via INTRAVENOUS

## 2023-11-19 MED ORDER — SODIUM CHLORIDE 0.9 % IV SOLN
125.0000 mg | Freq: Every day | INTRAVENOUS | Status: DC
  Administered 2023-11-19 – 2023-11-20 (×2): 125 mg via INTRAVENOUS
  Filled 2023-11-19 (×3): qty 10

## 2023-11-19 MED ORDER — HYDRALAZINE HCL 20 MG/ML IJ SOLN: 10.0000 mg | Freq: Four times a day (QID) | INTRAMUSCULAR | Status: DC | PRN

## 2023-11-19 SURGICAL SUPPLY — 14 items

## 2023-11-19 NOTE — Interval H&P Note (Signed)
History and Physical Interval Note:  11/19/2023 2:51 PM  Kelly Rios  has presented today for surgery, with the diagnosis of Anemia and GI bleed.  The various methods of treatment have been discussed with the patient and family. After consideration of risks, benefits and other options for treatment, the patient has consented to  Procedure(s): ESOPHAGOGASTRODUODENOSCOPY (EGD) WITH PROPOFOL (N/A) as a surgical intervention.  The patient's history has been reviewed, patient examined, no change in status, stable for surgery.  I have reviewed the patient's chart and labs.  Questions were answered to the patient's satisfaction.     Kelly Rios

## 2023-11-19 NOTE — Anesthesia Postprocedure Evaluation (Signed)
Anesthesia Post Note  Patient: Kelly Rios  Procedure(s) Performed: ESOPHAGOGASTRODUODENOSCOPY (EGD) WITH PROPOFOL     Patient location during evaluation: PACU Anesthesia Type: MAC Level of consciousness: awake and alert Pain management: pain level controlled Vital Signs Assessment: post-procedure vital signs reviewed and stable Respiratory status: spontaneous breathing, nonlabored ventilation, respiratory function stable and patient connected to nasal cannula oxygen Cardiovascular status: stable and blood pressure returned to baseline Postop Assessment: no apparent nausea or vomiting Anesthetic complications: no   No notable events documented.  Last Vitals:  Vitals:   11/19/23 1615 11/19/23 1644  BP: (!) 162/65 (!) 162/51  Pulse: 79 80  Resp: 20   Temp: 36.4 C 36.8 C  SpO2: 92% 95%    Last Pain:  Vitals:   11/19/23 1644  TempSrc: Axillary  PainSc:                  Nelle Don Conley Pawling

## 2023-11-19 NOTE — Anesthesia Preprocedure Evaluation (Addendum)
Anesthesia Evaluation  Patient identified by MRN, date of birth, ID band Patient awake    Reviewed: Allergy & Precautions, NPO status , Patient's Chart, lab work & pertinent test results  Airway Mallampati: II  TM Distance: >3 FB Neck ROM: Full    Dental no notable dental hx.    Pulmonary former smoker   Pulmonary exam normal        Cardiovascular hypertension, Pt. on medications and Pt. on home beta blockers  Rhythm:Regular Rate:Normal     Neuro/Psych negative neurological ROS  negative psych ROS   GI/Hepatic Neg liver ROS,GERD  ,,GIB   Endo/Other  diabetes, Type 2, Oral Hypoglycemic Agents    Renal/GU negative Renal ROS  negative genitourinary   Musculoskeletal   Abdominal Normal abdominal exam  (+)   Peds  Hematology  (+) Blood dyscrasia, anemia , REFUSES BLOOD PRODUCTS, JEHOVAH'S WITNESSLab Results      Component                Value               Date                      WBC                      7.8                 11/19/2023                HGB                      5.7 (LL)            11/19/2023                HCT                      20.4 (L)            11/19/2023                MCV                      74.2 (L)            11/19/2023                PLT                      314                 11/19/2023              Anesthesia Other Findings   Reproductive/Obstetrics                             Anesthesia Physical Anesthesia Plan  ASA: 3  Anesthesia Plan: MAC   Post-op Pain Management:    Induction:   PONV Risk Score and Plan: 2 and Propofol infusion and Treatment may vary due to age or medical condition  Airway Management Planned: Simple Face Mask and Nasal Cannula  Additional Equipment: None  Intra-op Plan:   Post-operative Plan:   Informed Consent: I have reviewed the patients History and Physical, chart, labs and discussed the procedure including the risks,  benefits and alternatives for the proposed anesthesia with the patient or  authorized representative who has indicated his/her understanding and acceptance.     Dental advisory given  Plan Discussed with: CRNA  Anesthesia Plan Comments:        Anesthesia Quick Evaluation

## 2023-11-19 NOTE — H&P (View-Only) (Signed)
Referring Provider: Dr. Marletta Lor Primary Care Physician:  Benetta Spar, MD Primary Gastroenterologist:  Dr. Marletta Lor  Reason for Consultation:  Anemia, GI bleed  HPI: Kelly Rios is a 72 y.o. female Jehovah's Witness with medical history significant of HTN, HLD, iron deficiency anemia, type 2 diabetes mellitus, GERD who presented to [facility] ED on 1/31 by direction of her PCP for abnormal labs.  Patient reports has been feeling increased fatigue, weakness over the last 2-3 months.  Was seen by her PCP and labs performed with noted low hemoglobin and was sent to the ED for further evaluation.  Patient does endorse daily aspirin use but denies any oral NSAIDs (uses Voltaren gel occasionally), no other antiplatelets or anticoagulant use.  Patient does endorse intermittent dark stools but thought this was due to her iron use.  Says that she has come off of her iron in the past and her stools are normal color.  No sign of red blood anywhere.  Takes occasional medication for acid reflux on an as needed basis.  Hgb 5.7 grams here.  Received IV iron.  Colonoscopy 12/2021 by Dr. Marletta Lor:  - Non- bleeding internal hemorrhoids. - Diverticulosis in the entire examined colon. - The examined portion of the ileum was normal. - The examination was otherwise normal. - No specimens collected.  EGD by Dr. Darrick Penna in 2013 that showed a stricture at the GE junction and multiple medium size nonbleeding ulcers in the gastric fundus and gastric body with nonerosive gastritis.  There is also duodenal inflammation in the bulb and second portion of the duodenum.  1. Duodenum, NOS biopsy - BENIGN SMALL BOWEL MUCOSA. - NO ACTIVE INFLAMMATION OR VILLOUS ATROPHY IDENTIFIED. 2. Stomach, biopsy, gastric ulcer - CHRONIC ACTIVE GASTRITIS WITH HELICOBACTER PYLORI ORGANISMS. - THERE IS NO EVIDENCE OF DYSPLASIA OR MALIGNANCY. - SEE COMMENT. 3. Stomach, biopsy - CHRONIC ACTIVE GASTRITIS WITH HELICOBACTER PYLORI  ORGANISMS. - THERE IS NO EVIDENCE OF DYSPLASIA OR MALIGNANCY. - SEE COMMENT.   Past Medical History:  Diagnosis Date   Anemia    COVID-19 virus infection 06/2020   Diverticulitis    GERD (gastroesophageal reflux disease)    High cholesterol    HTN (hypertension)     Past Surgical History:  Procedure Laterality Date   ABDOMINAL HYSTERECTOMY     CHOLECYSTECTOMY     COLONOSCOPY  08/25/2012   Procedure: COLONOSCOPY;  Surgeon: West Bali, MD;  Location: [facility];  Service: Endoscopy;  Laterality: N/A;  12:30-changed to 12:45 Soledad Gerlach notified   COLONOSCOPY WITH PROPOFOL N/A 12/22/2021   Procedure: COLONOSCOPY WITH PROPOFOL;  Surgeon: Lanelle Bal, DO;  Location: [facility];  Service: Endoscopy;  Laterality: N/A;  12:00 / ASA 2   ENDOVENOUS ABLATION SAPHENOUS VEIN W/ LASER Right 06/28/2018   endovenous laser ablation R GSV by Fabienne Bruns MD   ENDOVENOUS ABLATION SAPHENOUS VEIN W/ LASER Left 07/19/2018   endovenous laser ablation Left greater saphenous vein by Fabienne Bruns MD    ESOPHAGOGASTRODUODENOSCOPY  08/25/2012   Procedure: ESOPHAGOGASTRODUODENOSCOPY (EGD);  Surgeon: West Bali, MD;  Location: [facility];  Service: Endoscopy;  Laterality: N/A;   LASER ABLATION Right 07/28/2023   Laser ablation of the Right greater saphenous vein with >20 stab phlebectomies   PARTIAL HYSTERECTOMY     stab phlebectomy Left 04/25/2019   stab phlebectomy 10-20 incisions left leg by Fabienne Bruns MD    stab phlebectomy Right 05/23/2019   stab phlebectomy 10-20 incisions right  leg by Fabienne Bruns MD     Prior to Admission medications   Medication Sig Start Date End Date Taking? Authorizing Provider  aspirin EC 81 MG tablet Take 81 mg by mouth daily. Swallow whole.   Yes [provider]  atorvastatin (LIPITOR) 40 MG tablet Take 40 mg by mouth daily. 09/13/23  Yes [provider]  bisoprolol-hydrochlorothiazide (ZIAC) 10-6.25 MG per tablet Take 2  tablets by mouth daily.   Yes [provider]  calcium carbonate (OSCAL) 1500 (600 Ca) MG TABS tablet Take 600 mg of elemental calcium by mouth daily with breakfast.   Yes [provider]  cholecalciferol (VITAMIN D3) 25 MCG (1000 UNIT) tablet Take 1,000 Units by mouth daily.   Yes [provider]  cyanocobalamin (VITAMIN B12) 500 MCG tablet Take 500 mcg by mouth daily.   Yes [provider]  diclofenac Sodium (VOLTAREN ARTHRITIS PAIN) 1 % GEL Apply 2 g topically daily as needed (arthritis).   Yes [provider]  ferrous sulfate 325 (65 FE) MG tablet Take 325 mg by mouth every other day.   Yes [provider]  Homeopathic Products (LEG CRAMP RELIEF PO) Take 1 tablet by mouth as needed (leg cramps).   Yes [provider]  metFORMIN (GLUCOPHAGE) 500 MG tablet Take 500 mg by mouth daily with breakfast. 07/04/23  Yes [provider]  NYSTATIN powder Apply 1 application  topically every other day. 07/20/21  Yes [provider]  Potassium Chloride ER 20 MEQ TBCR Take 1 tablet by mouth at bedtime. 09/25/23  Yes [provider]    Current Facility-Administered Medications  Medication Dose Route Frequency Provider Last Rate Last Admin   0.9 %  sodium chloride infusion   Intravenous Continuous Uz[place], Eric J, DO 50 mL/hr at 11/19/23 0018 New Bag at 11/19/23 0018   acetaminophen (TYLENOL) tablet 650 mg  650 mg Oral Q6H PRN [place], Eric J, DO       Or   acetaminophen (TYLENOL) suppository 650 mg  650 mg Rectal Q6H PRN [place], Eric J, DO       albuterol (PROVENTIL) (2.5 MG/3ML) 0.083% nebulizer solution 2.5 mg  2.5 mg Nebulization Q2H PRN [place], Eric J, DO       atorvastatin (LIPITOR) tablet 20 mg  20 mg Oral QHS [place], Alvira Philips, DO   20 mg at 11/18/23 2358   ferric gluconate (FERRLECIT) 125 mg in sodium chloride 0.9 % 100 mL IVPB  125 mg Intravenous Daily Sundil, Subrina, MD 110 mL/hr at 11/19/23 0138 125 mg at  11/19/23 0138   hydrALAZINE (APRESOLINE) injection 10 mg  10 mg Intravenous Q8H PRN Sundil, Subrina, MD       insulin aspart (novoLOG) injection 0-6 Units  0-6 Units Subcutaneous TID WC [place], Eric J, DO       ondansetron [facility]) tablet 4 mg  4 mg Oral Q6H PRN [place], Eric J, DO       Or   ondansetron [facility]) injection 4 mg  4 mg Intravenous Q6H PRN [place], Eric J, DO       oxyCODONE (Oxy IR/ROXICODONE) immediate release tablet 5 mg  5 mg Oral Q4H PRN [place], Eric J, DO       pantoprazole (PROTONIX) injection 40 mg  40 mg Intravenous Q12H [place], Alvira Philips, DO   40 mg at 11/19/23 0019    Allergies as of 11/18/2023 - Review Complete 11/18/2023  Allergen Reaction Noted   Codeine Shortness Of Breath 01/05/2011  Family History  Problem Relation Age of Onset   Asthma Other    Diabetes Other    Colon cancer Maternal Grandmother        78s   Liver disease Neg Hx     Social History   Socioeconomic History   Marital status: Married    Spouse name: Not on file   Number of children: 4   Years of education: Not on file   Highest education level: Not on file  Occupational History   Occupation: Chief Technology Officer: [employer]   Occupation: Orthoptist  Tobacco Use   Smoking status: Former    Current packs/day: 0.50    Average packs/day: 0.5 packs/day for 15.0 years (7.5 ttl pk-yrs)    Types: Cigarettes   Smokeless tobacco: Never   Tobacco comments:    remote  Vaping Use   Vaping status: Never Used  Substance and Sexual Activity   Alcohol use: No   Drug use: No   Sexual activity: Not on file  Other Topics Concern   Not on file  Social History Narrative   Not on file   Social Drivers of Health   Financial Resource Strain: Not on file  Food Insecurity: No Food Insecurity (11/19/2023)   Hunger Vital Sign    Worried About Running Out of Food in the Last Year: Never true    Ran Out of Food in the Last Year: Never true  Transportation Needs: No Transportation Needs  (11/19/2023)   PRAPARE - Administrator, Civil Service (Medical): No    Lack of Transportation (Non-Medical): No  Physical Activity: Not on file  Stress: Not on file  Social Connections: Socially Integrated (11/19/2023)   Social Connection and Isolation Panel [NHANES]    Frequency of Communication with Friends and Family: More than three times a week    Frequency of Social Gatherings with Friends and Family: Once a week    Attends Religious Services: More than 4 times per year    Active Member of [employer] or Organizations: Yes    Attends Engineer, structural: More than 4 times per year    Marital Status: Married  Catering manager Violence: Not At Risk (11/19/2023)   Humiliation, Afraid, Rape, and Kick questionnaire    Fear of Current or Ex-Partner: No    Emotionally Abused: No    Physically Abused: No    Sexually Abused: No    Review of Systems: ROS is O/W negative except as mentioned in HPI.  Physical Exam: Vital signs in last 24 hours: Temp:  [97.6 F (36.4 C)-97.9 F (36.6 C)] 97.8 F (36.6 C) (02/01 0813) Pulse Rate:  [62-84] 74 (02/01 0813) Resp:  [18-66] 18 (02/01 0400) BP: (128-165)/(50-72) 151/62 (02/01 0813) SpO2:  [92 %-100 %] 97 % (02/01 0813) Weight:  [94.3 kg] 94.3 kg (01/31 1018) Last BM Date : 11/17/23 General:  Alert, Well-developed, well-nourished, pleasant and cooperative in NAD Head:  Normocephalic and atraumatic. Eyes:  Sclera clear, no icterus.  Conjunctiva pink. Ears:  Normal auditory acuity. Mouth:  No deformity or lesions.   Lungs:  Clear throughout to auscultation.  No wheezes, crackles, or rhonchi.  Heart:  Regular rate and rhythm; no murmurs, clicks, rubs, or gallops. Abdomen:  Soft, non-distended.  BS present.  Non-tender.   Rectal:  Not performed.  Heme positive in the ED.  Msk:  Symmetrical without gross deformities. Pulses:  Normal pulses noted. Extremities:  Without clubbing or edema. Neurologic:  Alert  and oriented x 4;   grossly normal neurologically. Skin:  Intact without significant lesions or rashes. Psych:  Alert and cooperative. Normal mood and affect.  Intake/Output from previous day: 01/31 0701 - 02/01 0700 In: 574.5 [P.O.:240; I.V.:234.5; IV Piggyback:100] Out: -   Lab Results: Recent Labs    11/18/23 1203 11/19/23 0631  WBC 9.4 7.8  HGB 5.9* 5.7*  HCT 20.9* 20.4*  PLT 305 314   BMET Recent Labs    11/18/23 1203 11/19/23 0631  NA 137 139  K 4.5 4.0  CL 104 106  CO2 21* 23  GLUCOSE 112* 127*  BUN 19 11  CREATININE 0.78 0.82  CALCIUM 9.3 9.2   LFT Recent Labs    11/18/23 1203  PROT 6.5  ALBUMIN 3.3*  AST 14*  ALT 14  ALKPHOS 73  BILITOT 0.5   PT/INR Recent Labs    11/19/23 0631  LABPROT 14.2  INR 1.1   Studies/Results: DG Chest Portable 1 View Result Date: 11/18/2023 CLINICAL DATA:  Shortness of breath and dizziness. EXAM: PORTABLE CHEST 1 VIEW COMPARISON:  None Available. FINDINGS: Large hiatal hernia. Normal cardiopericardial silhouette otherwise. No consolidation, pneumothorax or effusion. No edema. Degenerative changes of the spine. Artifact from the patient's clothing. IMPRESSION: Hiatal hernia.  No acute cardiopulmonary disease. Electronically Signed   By: Karen Kays M.D.   On: 11/18/2023 11:11   IMPRESSION:  *Anemia with hemoglobin down to 5.7 g.  Has a low MCV.  Hemoccult was positive.  Iron studies low.  She is Jehovah's Witness so does not agree to blood products, but did receive IV iron infusion.  BUN is normal. *Remote history of H. pylori in 2013, unsure if ever treated or followed up.  PLAN: -Agree with IV iron. -Monitor hemoglobin. -Is on pantoprazole 40 mg IV twice daily.  Should continue that. -EGD later today.   Princella Pellegrini. Zehr  11/19/2023, 8:59 AM  I have taken an interval history, thoroughly reviewed the chart and examined the patient. I agree with the Advanced Practitioner's note, impression and recommendations, and have recorded  additional findings, impressions and recommendations below. I performed a substantive portion of this encounter (>50% time spent), including a complete performance of the medical decision making.  My additional thoughts are as follows:  Chronic iron deficiency anemia of GI blood loss with heme positive black stool reported while patient is taking oral iron.  She reports having been found to be anemic at some point in the past by primary care and was placed on iron, though does not sound like she was ever referred to GI, hematology or had any additional investigation for the cause of this iron deficiency anemia. She takes the iron intermittently because it causes constipation that then leads to straining and hemorrhoidal bleeding.  Suspect this is most likely not a lower source of GI bleeding given her colonoscopy findings in March 2023  Could be upper GI or more distal small bowel  Plan today is for an upper endoscopy.  If low risk findings, and certainly if nothing requiring endoscopic therapy, then she can be most likely discharged home as soon follow-up with the [facility] group and consideration of a small bowel video capsule study. She has received at least 1 dose of IV iron, further treatment plan in that regard per the medical service, and it sounds like patient would also benefit from outpatient hematology evaluation given her reports of difficulty tolerating oral iron noted above.  History of H. pylori treated in  2013, unknown if eradication confirmed. Will biopsy the stomach during today's EGD  Risk benefits reviewed and she understood the increased sedation and cardiovascular risk given her severe anemia. (Husband and daughter in her room at the time of my evaluation)  The benefits and risks of the planned procedure were described in detail with the patient or (when appropriate) their health care proxy.  Risks were outlined as including, but not limited to, bleeding, infection,  perforation, adverse medication reaction leading to cardiac or pulmonary decompensation, pancreatitis (if ERCP).  The limitation of incomplete mucosal visualization was also discussed.  No guarantees or warranties were given.    Charlie Pitter III [phone]

## 2023-11-19 NOTE — Progress Notes (Signed)
21:19H Received patient from [facility] on Doctor, general practice accompanied by [employer] and oriented x4. On room air.  Bed wheels locked. Call bell within reach. Oriented to room set-up.

## 2023-11-19 NOTE — Progress Notes (Signed)
Patient has been transferred from [facility] to [facility] for GI evaluation.  Admitting physician Dr. Uzbekistan has been discussed case with [facility] Dr. Myrtie Neither who recommended transfer this patient to [facility].  Patient has been presented with melena hemoglobin 5.9.  She is declining blood transfusion as she is a Jehovah witness.  Agrees with iron transfusion only which is patient getting already. Informed [facility] Dr. Marina Goodell.  Waiting for further recommendation.   Tereasa Coop, MD [facility] 11/19/2023, 12:02 AM

## 2023-11-19 NOTE — Op Note (Signed)
[facility] Patient Name: Kelly Rios Procedure Date : 11/19/2023 MRN: [id number] Attending MD: Starr Lake. Myrtie Neither , MD, [id number] Date of Birth: [birth year] CSN: [id number] Age: 73 Admit Type: Inpatient Procedure:                Upper GI endoscopy Indications:              Suspected upper gastrointestinal bleeding in                            patient with unexplained iron deficiency anemia,                            Heme positive stool Providers:                Sherilyn Cooter L. Myrtie Neither, MD, Eliberto Ivory, RN, Kandice Robinsons, Technician Referring MD:             [facility] Medicines:                Monitored Anesthesia Care Complications:            No immediate complications. Estimated Blood Loss:     Estimated blood loss: none. Procedure:                Pre-Anesthesia Assessment:                           - Prior to the procedure, a History and Physical                            was performed, and patient medications and                            allergies were reviewed. The patient's tolerance of                            previous anesthesia was also reviewed. The risks                            and benefits of the procedure and the sedation                            options and risks were discussed with the patient.                            All questions were answered, and informed consent                            was obtained. Prior Anticoagulants: The patient has                            taken no anticoagulant or antiplatelet agents. ASA  Grade Assessment: IV - A patient with severe                            systemic disease that is a constant threat to life.                            After reviewing the risks and benefits, the patient                            was deemed in satisfactory condition to undergo the                            procedure.                           After obtaining informed consent,  the endoscope was                            passed under direct vision. Throughout the                            procedure, the patient's blood pressure, pulse, and                            oxygen saturations were monitored continuously. The                            [id number]-H190 ([id number]) Olympus endoscope was introduced                            through the mouth, and advanced to the third part                            of duodenum. The upper GI endoscopy was                            accomplished without difficulty. The patient                            tolerated the procedure fairly well. (Brief oxygen                            desaturation requiring chin lift/jaw thrust and                            bag-valve-mask support with anesthesia) Scope In: Scope Out: Findings:      A large hiatal hernia was present. Approximately a third of the stomach       was herniated causing the distal esophagus to be tortuous.      Multiple dispersed linear erosions with no active bleeding were found in       the gastric fundus. Few scattered flecks of hematin seen as well. These       are typical "Cameron erosions" associated with a hiatal hernia.  The exam of the stomach was otherwise normal.      The examined duodenum was normal. Impression:               - Large hiatal hernia.                           - Gastric erosions with no bleeding and no stigmata                            of recent bleeding.                           - Normal examined duodenum.                           - No specimens collected.                           See inpatient GI consult note. Lower GI source of                            blood loss not highly suspected given patient's                            colonoscopy March 2023. These hiatal hernia related                            erosions are common cause of iron deficiency anemia. Recommendation:           - Return patient to hospital ward for possible                             discharge same day.                           - Resume regular diet.                           - Pantoprazole 40 mg by mouth twice daily                           This patient can be discharged home from a GI                            perspective, and she needs close follow-up with her                            outpatient GI group in [place] to discuss                            ongoing management of the anemia and hiatal hernia.                           She has received IV iron in the hospital, and needs  more IV iron dosing in the outpatient setting                            because she is unable to tolerate oral iron due to                            constipation.                           If this patient's iron deficiency anemia does not                            improve sufficiently with adequate IV iron dosing,                            then her regular GI practice can consider the need                            for a small bowel video capsule study. If so, it                            would have to be endoscopically delivered due to                            the size of the hiatal hernia and resultant distal                            esophageal tortuosity.                           - The findings and recommendations were discussed                            with the patient's family. (Husband-by phone)                            patient was also given a copy of this report. Procedure Code(s):        --- Professional ---                           [phone], Esophagogastroduodenoscopy, flexible,                            transoral; diagnostic, including collection of                            specimen(s) by brushing or washing, when performed                            (separate procedure) Diagnosis Code(s):        --- Professional ---                           K44.9, Diaphragmatic hernia without obstruction or  gangrene                           K25.9, Gastric ulcer, unspecified as acute or                            chronic, without hemorrhage or perforation                           D50.9, Iron deficiency anemia, unspecified                           R19.5, Other fecal abnormalities CPT copyright 2022 American Medical Association. All rights reserved. The codes documented in this report are preliminary and upon coder review may  be revised to meet current compliance requirements. Copelan Maultsby L. Myrtie Neither, MD 11/19/2023 4:01:37 PM This report has been signed electronically. Number of Addenda: 0

## 2023-11-19 NOTE — Progress Notes (Signed)
PROGRESS NOTE  Kelly Rios LKG:[id number] DOB: 1951-03-27 DOA: 11/18/2023 PCP: Benetta Spar, MD  HPI/Recap of past 24 hours: Kelly Rios is a 73 y.o. female Jehovah's Witness with medical history significant of HTN, HLD, iron deficiency anemia, DM2, GERD who presented to [facility] ED on 1/31 by direction of her PCP for low hemoglobin.  Patient has been reporting generalized weakness over the past couple of months.  Reports taking daily aspirin only.  Does admit to dark stools but has also been on iron pills. In the ED, hemoglobin 5.9, anemia panel with iron 18, TIBC high 463.  FOBT positive.  Chest x-ray with no acute cardiopulmonary disease process.  GI was consulted. TRH consulted for admission.   Today, patient denies any new complaints, no bright red blood per rectum or melena.  Denies any abdominal pain, chest pain, shortness of breath, fever/chills.    Assessment/Plan: Principal Problem:   UGIB (upper gastrointestinal bleed) Active Problems:   Iron deficiency anemia   Essential hypertension   Controlled type 2 diabetes mellitus without complication, without long-term current use of insulin (HCC)   HLD (hyperlipidemia)   Symptomatic iron deficiency anemia concerning for upper GI bleed Hemoglobin on admission 5.9 (last hemoglobin 14.4 in 2019) Anemia panel with iron 18, TIBC high 463 FOBT positive Complicated by her Jehovah's Witness status, declining blood transfusions IV iron daily x 4 Protonix 40 mg IV every 12 hours GI on board, plan for endoscopy Monitor closely, will not be performing frequent blood draws   Essential hypertension PTA on bisoprolol-HCTZ 20-12.5 mg p.o. daily, losartan 100 mg p.o. daily. Hold home antihypertensives for now Hydralazine 5mg  IV q6h PRN SBP >165   Hyperlipidemia Continue atorvastatin   Type 2 diabetes mellitus Last A1c is 7 PTA on metformin 500 mg p.o. daily Very sensitive SSI for coverage, CBGs qAC/HS    GERD On IV Protonix as above   Jehovah's Witness Patient's refusing blood products but agreeable for IV iron  Obesity Lifestyle modification advised    Estimated body mass index is 33.55 kg/m as calculated from the following:   Height as of this encounter: 5\' 6"  (1.676 m).   Weight as of this encounter: 94.3 kg.     Code Status: Full  Family Communication: None at bedside  Disposition Plan: Status is: Inpatient Remains inpatient appropriate because: Level of care      Consultants: GI  Procedures: EGD  Antimicrobials: None  DVT prophylaxis: SCDs   Objective: Vitals:   11/19/23 0813 11/19/23 0922 11/19/23 1238 11/19/23 1452  BP: (!) 151/62  (!) 148/61 (!) 159/63  Pulse: 74  79 75  Resp:  (!) 26  16  Temp: 97.8 F (36.6 C)  97.6 F (36.4 C) 98.7 F (37.1 C)  TempSrc: Oral  Oral Temporal  SpO2: 97%  98% 96%  Weight:      Height:        Intake/Output Summary (Last 24 hours) at 11/19/2023 1459 Last data filed at 11/19/2023 0600 Gross per 24 hour  Intake 574.49 ml  Output --  Net 574.49 ml   Filed Weights   11/18/23 1018  Weight: 94.3 kg    Exam: General: NAD  Cardiovascular: S1, S2 present Respiratory: CTAB Abdomen: Soft, nontender, nondistended, bowel sounds present Musculoskeletal: No bilateral pedal edema noted Skin: Normal Psychiatry: Normal mood     Data Reviewed: CBC: Recent Labs  Lab 11/18/23 1203 11/19/23 0631  WBC 9.4 7.8  NEUTROABS 6.4  --  HGB 5.9* 5.7*  HCT 20.9* 20.4*  MCV 76.6* 74.2*  PLT 305 314   Basic Metabolic Panel: Recent Labs  Lab 11/18/23 1203 11/19/23 0631  NA 137 139  K 4.5 4.0  CL 104 106  CO2 21* 23  GLUCOSE 112* 127*  BUN 19 11  CREATININE 0.78 0.82  CALCIUM 9.3 9.2   GFR: Estimated Creatinine Clearance: 72.8 mL/min (by C-G formula based on SCr of 0.82 mg/dL). Liver Function Tests: Recent Labs  Lab 11/18/23 1203  AST 14*  ALT 14  ALKPHOS 73  BILITOT 0.5  PROT 6.5  ALBUMIN 3.3*    No results for input(s): "LIPASE", "AMYLASE" in the last 168 hours. No results for input(s): "AMMONIA" in the last 168 hours. Coagulation Profile: Recent Labs  Lab 11/19/23 0631  INR 1.1   Cardiac Enzymes: No results for input(s): "CKTOTAL", "CKMB", "CKMBINDEX", "TROPONINI" in the last 168 hours. BNP (last 3 results) No results for input(s): "PROBNP" in the last 8760 hours. HbA1C: Recent Labs    11/19/23 0631  HGBA1C 7.0*   CBG: Recent Labs  Lab 11/18/23 1311 11/18/23 2254 11/19/23 0604 11/19/23 0924 11/19/23 1239  GLUCAP 104* 107* 122* 113* 152*   Lipid Profile: No results for input(s): "CHOL", "HDL", "LDLCALC", "TRIG", "CHOLHDL", "LDLDIRECT" in the last 72 hours. Thyroid Function Tests: Recent Labs    11/19/23 0631  TSH 1.448   Anemia Panel: Recent Labs    11/18/23 1203  TIBC 463*  IRON 18*   Urine analysis:    Component Value Date/Time   BILIRUBINUR negative 04/19/2020 1127   KETONESUR trace (5) (A) 04/19/2020 1127   PROTEINUR negative 04/19/2020 1127   UROBILINOGEN 0.2 04/19/2020 1127   NITRITE Negative 04/19/2020 1127   LEUKOCYTESUR Negative 04/19/2020 1127   Sepsis Labs: @LABRCNTIP (procalcitonin:4,lacticidven:4)  )No results found for this or any previous visit (from the past 240 hours).    Studies: No results found.  Scheduled Meds:  [MAR Hold] atorvastatin  20 mg Oral QHS   [MAR Hold] insulin aspart  0-6 Units Subcutaneous TID WC   [MAR Hold] pantoprazole (PROTONIX) IV  40 mg Intravenous Q12H    Continuous Infusions:  [MAR Hold] ferric gluconate (FERRLECIT) IVPB 125 mg (11/19/23 0138)     LOS: 1 day     Briant Cedar, MD [facility]  If 7PM-7AM, please contact night-coverage [email or web address] 11/19/2023, 2:59 PM

## 2023-11-19 NOTE — Consult Note (Addendum)
Referring Provider: Dr. Marletta Lor Primary Care Physician:  Benetta Spar, MD Primary Gastroenterologist:  Dr. Marletta Lor  Reason for Consultation:  Anemia, GI bleed  HPI: Kelly Rios is a 72 y.o. female Jehovah's Witness with medical history significant of HTN, HLD, iron deficiency anemia, type 2 diabetes mellitus, GERD who presented to [facility] ED on 1/31 by direction of her PCP for abnormal labs.  Patient reports has been feeling increased fatigue, weakness over the last 2-3 months.  Was seen by her PCP and labs performed with noted low hemoglobin and was sent to the ED for further evaluation.  Patient does endorse daily aspirin use but denies any oral NSAIDs (uses Voltaren gel occasionally), no other antiplatelets or anticoagulant use.  Patient does endorse intermittent dark stools but thought this was due to her iron use.  Says that she has come off of her iron in the past and her stools are normal color.  No sign of red blood anywhere.  Takes occasional medication for acid reflux on an as needed basis.  Hgb 5.7 grams here.  Received IV iron.  Colonoscopy 12/2021 by Dr. Marletta Lor:  - Non- bleeding internal hemorrhoids. - Diverticulosis in the entire examined colon. - The examined portion of the ileum was normal. - The examination was otherwise normal. - No specimens collected.  EGD by Dr. Darrick Penna in 2013 that showed a stricture at the GE junction and multiple medium size nonbleeding ulcers in the gastric fundus and gastric body with nonerosive gastritis.  There is also duodenal inflammation in the bulb and second portion of the duodenum.  1. Duodenum, NOS biopsy - BENIGN SMALL BOWEL MUCOSA. - NO ACTIVE INFLAMMATION OR VILLOUS ATROPHY IDENTIFIED. 2. Stomach, biopsy, gastric ulcer - CHRONIC ACTIVE GASTRITIS WITH HELICOBACTER PYLORI ORGANISMS. - THERE IS NO EVIDENCE OF DYSPLASIA OR MALIGNANCY. - SEE COMMENT. 3. Stomach, biopsy - CHRONIC ACTIVE GASTRITIS WITH HELICOBACTER PYLORI  ORGANISMS. - THERE IS NO EVIDENCE OF DYSPLASIA OR MALIGNANCY. - SEE COMMENT.   Past Medical History:  Diagnosis Date   Anemia    COVID-19 virus infection 06/2020   Diverticulitis    GERD (gastroesophageal reflux disease)    High cholesterol    HTN (hypertension)     Past Surgical History:  Procedure Laterality Date   ABDOMINAL HYSTERECTOMY     CHOLECYSTECTOMY     COLONOSCOPY  08/25/2012   Procedure: COLONOSCOPY;  Surgeon: West Bali, MD;  Location: [facility];  Service: Endoscopy;  Laterality: N/A;  12:30-changed to 12:45 Soledad Gerlach notified   COLONOSCOPY WITH PROPOFOL N/A 12/22/2021   Procedure: COLONOSCOPY WITH PROPOFOL;  Surgeon: Lanelle Bal, DO;  Location: [facility];  Service: Endoscopy;  Laterality: N/A;  12:00 / ASA 2   ENDOVENOUS ABLATION SAPHENOUS VEIN W/ LASER Right 06/28/2018   endovenous laser ablation R GSV by Fabienne Bruns MD   ENDOVENOUS ABLATION SAPHENOUS VEIN W/ LASER Left 07/19/2018   endovenous laser ablation Left greater saphenous vein by Fabienne Bruns MD    ESOPHAGOGASTRODUODENOSCOPY  08/25/2012   Procedure: ESOPHAGOGASTRODUODENOSCOPY (EGD);  Surgeon: West Bali, MD;  Location: [facility];  Service: Endoscopy;  Laterality: N/A;   LASER ABLATION Right 07/28/2023   Laser ablation of the Right greater saphenous vein with >20 stab phlebectomies   PARTIAL HYSTERECTOMY     stab phlebectomy Left 04/25/2019   stab phlebectomy 10-20 incisions left leg by Fabienne Bruns MD    stab phlebectomy Right 05/23/2019   stab phlebectomy 10-20 incisions right  leg by Fabienne Bruns MD     Prior to Admission medications   Medication Sig Start Date End Date Taking? Authorizing Provider  aspirin EC 81 MG tablet Take 81 mg by mouth daily. Swallow whole.   Yes [provider]  atorvastatin (LIPITOR) 40 MG tablet Take 40 mg by mouth daily. 09/13/23  Yes [provider]  bisoprolol-hydrochlorothiazide (ZIAC) 10-6.25 MG per tablet Take 2  tablets by mouth daily.   Yes [provider]  calcium carbonate (OSCAL) 1500 (600 Ca) MG TABS tablet Take 600 mg of elemental calcium by mouth daily with breakfast.   Yes [provider]  cholecalciferol (VITAMIN D3) 25 MCG (1000 UNIT) tablet Take 1,000 Units by mouth daily.   Yes [provider]  cyanocobalamin (VITAMIN B12) 500 MCG tablet Take 500 mcg by mouth daily.   Yes [provider]  diclofenac Sodium (VOLTAREN ARTHRITIS PAIN) 1 % GEL Apply 2 g topically daily as needed (arthritis).   Yes [provider]  ferrous sulfate 325 (65 FE) MG tablet Take 325 mg by mouth every other day.   Yes [provider]  Homeopathic Products (LEG CRAMP RELIEF PO) Take 1 tablet by mouth as needed (leg cramps).   Yes [provider]  metFORMIN (GLUCOPHAGE) 500 MG tablet Take 500 mg by mouth daily with breakfast. 07/04/23  Yes [provider]  NYSTATIN powder Apply 1 application  topically every other day. 07/20/21  Yes [provider]  Potassium Chloride ER 20 MEQ TBCR Take 1 tablet by mouth at bedtime. 09/25/23  Yes [provider]    Current Facility-Administered Medications  Medication Dose Route Frequency Provider Last Rate Last Admin   0.9 %  sodium chloride infusion   Intravenous Continuous [place], Eric J, DO 50 mL/hr at 11/19/23 0018 New Bag at 11/19/23 0018   acetaminophen (TYLENOL) tablet 650 mg  650 mg Oral Q6H PRN [place], Eric J, DO       Or   acetaminophen (TYLENOL) suppository 650 mg  650 mg Rectal Q6H PRN [place], Eric J, DO       albuterol (PROVENTIL) (2.5 MG/3ML) 0.083% nebulizer solution 2.5 mg  2.5 mg Nebulization Q2H PRN [place], Eric J, DO       atorvastatin (LIPITOR) tablet 20 mg  20 mg Oral QHS [place], Alvira Philips, DO   20 mg at 11/18/23 2358   ferric gluconate (FERRLECIT) 125 mg in sodium chloride 0.9 % 100 mL IVPB  125 mg Intravenous Daily Sundil, Subrina, MD 110 mL/hr at 11/19/23 0138 125 mg at  11/19/23 0138   hydrALAZINE (APRESOLINE) injection 10 mg  10 mg Intravenous Q8H PRN Sundil, Subrina, MD       insulin aspart (novoLOG) injection 0-6 Units  0-6 Units Subcutaneous TID WC [place], Eric J, DO       ondansetron [facility]) tablet 4 mg  4 mg Oral Q6H PRN [place], Eric J, DO       Or   ondansetron [facility]) injection 4 mg  4 mg Intravenous Q6H PRN [place], Eric J, DO       oxyCODONE (Oxy IR/ROXICODONE) immediate release tablet 5 mg  5 mg Oral Q4H PRN [place], Eric J, DO       pantoprazole (PROTONIX) injection 40 mg  40 mg Intravenous Q12H [place], Alvira Philips, DO   40 mg at 11/19/23 0019    Allergies as of 11/18/2023 - Review Complete 11/18/2023  Allergen Reaction Noted   Codeine Shortness Of Breath 01/05/2011  Family History  Problem Relation Age of Onset   Asthma Other    Diabetes Other    Colon cancer Maternal Grandmother        78s   Liver disease Neg Hx     Social History   Socioeconomic History   Marital status: Married    Spouse name: Not on file   Number of children: 4   Years of education: Not on file   Highest education level: Not on file  Occupational History   Occupation: Chief Technology Officer: [employer]   Occupation: Orthoptist  Tobacco Use   Smoking status: Former    Current packs/day: 0.50    Average packs/day: 0.5 packs/day for 15.0 years (7.5 ttl pk-yrs)    Types: Cigarettes   Smokeless tobacco: Never   Tobacco comments:    remote  Vaping Use   Vaping status: Never Used  Substance and Sexual Activity   Alcohol use: No   Drug use: No   Sexual activity: Not on file  Other Topics Concern   Not on file  Social History Narrative   Not on file   Social Drivers of Health   Financial Resource Strain: Not on file  Food Insecurity: No Food Insecurity (11/19/2023)   Hunger Vital Sign    Worried About Running Out of Food in the Last Year: Never true    Ran Out of Food in the Last Year: Never true  Transportation Needs: No Transportation Needs  (11/19/2023)   PRAPARE - Administrator, Civil Service (Medical): No    Lack of Transportation (Non-Medical): No  Physical Activity: Not on file  Stress: Not on file  Social Connections: Socially Integrated (11/19/2023)   Social Connection and Isolation Panel [NHANES]    Frequency of Communication with Friends and Family: More than three times a week    Frequency of Social Gatherings with Friends and Family: Once a week    Attends Religious Services: More than 4 times per year    Active Member of [employer] or Organizations: Yes    Attends Engineer, structural: More than 4 times per year    Marital Status: Married  Catering manager Violence: Not At Risk (11/19/2023)   Humiliation, Afraid, Rape, and Kick questionnaire    Fear of Current or Ex-Partner: No    Emotionally Abused: No    Physically Abused: No    Sexually Abused: No    Review of Systems: ROS is O/W negative except as mentioned in HPI.  Physical Exam: Vital signs in last 24 hours: Temp:  [97.6 F (36.4 C)-97.9 F (36.6 C)] 97.8 F (36.6 C) (02/01 0813) Pulse Rate:  [62-84] 74 (02/01 0813) Resp:  [18-66] 18 (02/01 0400) BP: (128-165)/(50-72) 151/62 (02/01 0813) SpO2:  [92 %-100 %] 97 % (02/01 0813) Weight:  [94.3 kg] 94.3 kg (01/31 1018) Last BM Date : 11/17/23 General:  Alert, Well-developed, well-nourished, pleasant and cooperative in NAD Head:  Normocephalic and atraumatic. Eyes:  Sclera clear, no icterus.  Conjunctiva pink. Ears:  Normal auditory acuity. Mouth:  No deformity or lesions.   Lungs:  Clear throughout to auscultation.  No wheezes, crackles, or rhonchi.  Heart:  Regular rate and rhythm; no murmurs, clicks, rubs, or gallops. Abdomen:  Soft, non-distended.  BS present.  Non-tender.   Rectal:  Not performed.  Heme positive in the ED.  Msk:  Symmetrical without gross deformities. Pulses:  Normal pulses noted. Extremities:  Without clubbing or edema. Neurologic:  Alert  and oriented x 4;   grossly normal neurologically. Skin:  Intact without significant lesions or rashes. Psych:  Alert and cooperative. Normal mood and affect.  Intake/Output from previous day: 01/31 0701 - 02/01 0700 In: 574.5 [P.O.:240; I.V.:234.5; IV Piggyback:100] Out: -   Lab Results: Recent Labs    11/18/23 1203 11/19/23 0631  WBC 9.4 7.8  HGB 5.9* 5.7*  HCT 20.9* 20.4*  PLT 305 314   BMET Recent Labs    11/18/23 1203 11/19/23 0631  NA 137 139  K 4.5 4.0  CL 104 106  CO2 21* 23  GLUCOSE 112* 127*  BUN 19 11  CREATININE 0.78 0.82  CALCIUM 9.3 9.2   LFT Recent Labs    11/18/23 1203  PROT 6.5  ALBUMIN 3.3*  AST 14*  ALT 14  ALKPHOS 73  BILITOT 0.5   PT/INR Recent Labs    11/19/23 0631  LABPROT 14.2  INR 1.1   Studies/Results: DG Chest Portable 1 View Result Date: 11/18/2023 CLINICAL DATA:  Shortness of breath and dizziness. EXAM: PORTABLE CHEST 1 VIEW COMPARISON:  None Available. FINDINGS: Large hiatal hernia. Normal cardiopericardial silhouette otherwise. No consolidation, pneumothorax or effusion. No edema. Degenerative changes of the spine. Artifact from the patient's clothing. IMPRESSION: Hiatal hernia.  No acute cardiopulmonary disease. Electronically Signed   By: Karen Kays M.D.   On: 11/18/2023 11:11   IMPRESSION:  *Anemia with hemoglobin down to 5.7 g.  Has a low MCV.  Hemoccult was positive.  Iron studies low.  She is Jehovah's Witness so does not agree to blood products, but did receive IV iron infusion.  BUN is normal. *Remote history of H. pylori in 2013, unsure if ever treated or followed up.  PLAN: -Agree with IV iron. -Monitor hemoglobin. -Is on pantoprazole 40 mg IV twice daily.  Should continue that. -EGD later today.   Princella Pellegrini. Zehr  11/19/2023, 8:59 AM  I have taken an interval history, thoroughly reviewed the chart and examined the patient. I agree with the Advanced Practitioner's note, impression and recommendations, and have recorded  additional findings, impressions and recommendations below. I performed a substantive portion of this encounter (>50% time spent), including a complete performance of the medical decision making.  My additional thoughts are as follows:  Chronic iron deficiency anemia of GI blood loss with heme positive black stool reported while patient is taking oral iron.  She reports having been found to be anemic at some point in the past by primary care and was placed on iron, though does not sound like she was ever referred to GI, hematology or had any additional investigation for the cause of this iron deficiency anemia. She takes the iron intermittently because it causes constipation that then leads to straining and hemorrhoidal bleeding.  Suspect this is most likely not a lower source of GI bleeding given her colonoscopy findings in March 2023  Could be upper GI or more distal small bowel  Plan today is for an upper endoscopy.  If low risk findings, and certainly if nothing requiring endoscopic therapy, then she can be most likely discharged home as soon follow-up with the [facility] and consideration of a small bowel video capsule study. She has received at least 1 dose of IV iron, further treatment plan in that regard per the medical service, and it sounds like patient would also benefit from outpatient hematology evaluation given her reports of difficulty tolerating oral iron noted above.  History of H. pylori treated in  2013, unknown if eradication confirmed. Will biopsy the stomach during today's EGD  Risk benefits reviewed and she understood the increased sedation and cardiovascular risk given her severe anemia. (Husband and daughter in her room at the time of my evaluation)  The benefits and risks of the planned procedure were described in detail with the patient or (when appropriate) their health care proxy.  Risks were outlined as including, but not limited to, bleeding, infection,  perforation, adverse medication reaction leading to cardiac or pulmonary decompensation, pancreatitis (if ERCP).  The limitation of incomplete mucosal visualization was also discussed.  No guarantees or warranties were given.    Charlie Pitter III [phone]

## 2023-11-19 NOTE — Transfer of Care (Signed)
Immediate Anesthesia Transfer of Care Note  Patient: Kelly Rios  Procedure(s) Performed: ESOPHAGOGASTRODUODENOSCOPY (EGD) WITH PROPOFOL  Patient Location: PACU  Anesthesia Type:MAC  Level of Consciousness: awake, alert , and oriented  Airway & Oxygen Therapy: Patient Spontanous Breathing and Patient connected to nasal cannula oxygen  Post-op Assessment: Report given to RN and Post -op Vital signs reviewed and stable  Post vital signs: Reviewed and stable  Last Vitals:  Vitals Value Taken Time  BP 151/69 11/19/23 1600  Temp    Pulse 83 11/19/23 1602  Resp 20 11/19/23 1602  SpO2 95 % 11/19/23 1602  Vitals shown include unfiled device data.  Last Pain:  Vitals:   11/19/23 1452  TempSrc: Temporal  PainSc: 0-No pain         Complications: No notable events documented.

## 2023-11-19 NOTE — Plan of Care (Signed)
  Problem: Education: Goal: Knowledge of General Education information will improve Description: Including pain rating scale, medication(s)/side effects and non-pharmacologic comfort measures Outcome: Progressing   Problem: Clinical Measurements: Goal: Ability to maintain clinical measurements within normal limits will improve Outcome: Progressing Goal: Will remain free from infection Outcome: Progressing Goal: Diagnostic test results will improve Outcome: Progressing Goal: Respiratory complications will improve Outcome: Progressing Goal: Cardiovascular complication will be avoided Outcome: Progressing   Problem: Activity: Goal: Risk for activity intolerance will decrease Outcome: Progressing   Problem: Elimination: Goal: Will not experience complications related to bowel motility Outcome: Progressing Goal: Will not experience complications related to urinary retention Outcome: Progressing   Problem: Skin Integrity: Goal: Risk for impaired skin integrity will decrease Outcome: Progressing   Problem: Safety: Goal: Ability to remain free from injury will improve Outcome: Progressing   Problem: Skin Integrity: Goal: Risk for impaired skin integrity will decrease Outcome: Progressing

## 2023-11-20 DIAGNOSIS — K922 Gastrointestinal hemorrhage, unspecified: Secondary | ICD-10-CM | POA: Diagnosis not present

## 2023-11-20 LAB — CBC
HCT: 21.5 % — ABNORMAL LOW (ref 36.0–46.0)
Hemoglobin: 5.8 g/dL — CL (ref 12.0–15.0)
MCH: 20.4 pg — ABNORMAL LOW (ref 26.0–34.0)
MCHC: 27 g/dL — ABNORMAL LOW (ref 30.0–36.0)
MCV: 75.4 fL — ABNORMAL LOW (ref 80.0–100.0)
Platelets: 342 10*3/uL (ref 150–400)
RBC: 2.85 MIL/uL — ABNORMAL LOW (ref 3.87–5.11)
RDW: 17.4 % — ABNORMAL HIGH (ref 11.5–15.5)
WBC: 7.2 10*3/uL (ref 4.0–10.5)
nRBC: 0.8 % — ABNORMAL HIGH (ref 0.0–0.2)

## 2023-11-20 LAB — GLUCOSE, CAPILLARY
Glucose-Capillary: 128 mg/dL — ABNORMAL HIGH (ref 70–99)
Glucose-Capillary: 129 mg/dL — ABNORMAL HIGH (ref 70–99)

## 2023-11-20 MED ORDER — POLYETHYLENE GLYCOL 3350 17 G PO PACK: 17.0000 g | PACK | Freq: Every day | ORAL | 0 refills | Status: AC

## 2023-11-20 MED ORDER — FERROUS SULFATE 325 (65 FE) MG PO TABS: 325.0000 mg | ORAL_TABLET | Freq: Every day | ORAL | 0 refills | Status: AC

## 2023-11-20 MED ORDER — PANTOPRAZOLE SODIUM 40 MG PO TBEC: 40.0000 mg | DELAYED_RELEASE_TABLET | Freq: Two times a day (BID) | ORAL | Status: DC

## 2023-11-20 MED ORDER — PANTOPRAZOLE SODIUM 40 MG PO TBEC: 40.0000 mg | DELAYED_RELEASE_TABLET | Freq: Two times a day (BID) | ORAL | 0 refills | Status: AC

## 2023-11-20 MED ORDER — SENNOSIDES-DOCUSATE SODIUM 8.6-50 MG PO TABS: 1.0000 | ORAL_TABLET | Freq: Every day | ORAL | 0 refills | Status: AC

## 2023-11-20 NOTE — Plan of Care (Signed)
Pt alert and oriented x 4. Denies any pain or discomfort. IV is patent and saline locked. Ambulating in room to bathroom. Continent of bowel and bladder. Discharge instructions completed with pt. All questions and concerns answered. Pt stable upon discharge.   Problem: Education: Goal: Knowledge of General Education information will improve Description: Including pain rating scale, medication(s)/side effects and non-pharmacologic comfort measures Outcome: Progressing   Problem: Health Behavior/Discharge Planning: Goal: Ability to manage health-related needs will improve Outcome: Progressing   Problem: Clinical Measurements: Goal: Ability to maintain clinical measurements within normal limits will improve Outcome: Progressing Goal: Will remain free from infection Outcome: Progressing Goal: Diagnostic test results will improve Outcome: Progressing Goal: Respiratory complications will improve Outcome: Progressing Goal: Cardiovascular complication will be avoided Outcome: Progressing   Problem: Activity: Goal: Risk for activity intolerance will decrease Outcome: Progressing   Problem: Nutrition: Goal: Adequate nutrition will be maintained Outcome: Progressing   Problem: Coping: Goal: Level of anxiety will decrease Outcome: Progressing   Problem: Elimination: Goal: Will not experience complications related to bowel motility Outcome: Progressing Goal: Will not experience complications related to urinary retention Outcome: Progressing   Problem: Pain Managment: Goal: General experience of comfort will improve and/or be controlled Outcome: Progressing   Problem: Safety: Goal: Ability to remain free from injury will improve Outcome: Progressing   Problem: Skin Integrity: Goal: Risk for impaired skin integrity will decrease Outcome: Progressing   Problem: Education: Goal: Ability to describe self-care measures that may prevent or decrease complications (Diabetes Survival  Skills Education) will improve Outcome: Progressing Goal: Individualized Educational Video(s) Outcome: Progressing   Problem: Coping: Goal: Ability to adjust to condition or change in health will improve Outcome: Progressing   Problem: Fluid Volume: Goal: Ability to maintain a balanced intake and output will improve Outcome: Progressing   Problem: Health Behavior/Discharge Planning: Goal: Ability to identify and utilize available resources and services will improve Outcome: Progressing Goal: Ability to manage health-related needs will improve Outcome: Progressing   Problem: Metabolic: Goal: Ability to maintain appropriate glucose levels will improve Outcome: Progressing   Problem: Nutritional: Goal: Maintenance of adequate nutrition will improve Outcome: Progressing Goal: Progress toward achieving an optimal weight will improve Outcome: Progressing   Problem: Skin Integrity: Goal: Risk for impaired skin integrity will decrease Outcome: Progressing   Problem: Tissue Perfusion: Goal: Adequacy of tissue perfusion will improve Outcome: Progressing

## 2023-11-20 NOTE — Plan of Care (Signed)
  Problem: Health Behavior/Discharge Planning: Goal: Ability to manage health-related needs will improve Outcome: Progressing   Problem: Clinical Measurements: Goal: Ability to maintain clinical measurements within normal limits will improve Outcome: Progressing Goal: Will remain free from infection Outcome: Progressing Goal: Diagnostic test results will improve Outcome: Progressing Goal: Respiratory complications will improve Outcome: Progressing Goal: Cardiovascular complication will be avoided Outcome: Progressing   Problem: Activity: Goal: Risk for activity intolerance will decrease Outcome: Progressing   Problem: Nutrition: Goal: Adequate nutrition will be maintained Outcome: Progressing   Problem: Elimination: Goal: Will not experience complications related to bowel motility Outcome: Progressing Goal: Will not experience complications related to urinary retention Outcome: Progressing   Problem: Safety: Goal: Ability to remain free from injury will improve Outcome: Progressing   Problem: Skin Integrity: Goal: Risk for impaired skin integrity will decrease Outcome: Progressing   Problem: Nutritional: Goal: Maintenance of adequate nutrition will improve Outcome: Progressing Goal: Progress toward achieving an optimal weight will improve Outcome: Progressing

## 2023-11-20 NOTE — Discharge Summary (Signed)
Physician Discharge Summary   Patient: Kelly Rios MRN: [id number] DOB: 1951-10-05  Admit date:     11/18/2023  Discharge date: 11/20/23  Discharge Physician: Briant Cedar   PCP: Benetta Spar, MD   Recommendations at discharge:   Follow-up with PCP, with repeat labs Follow-up with outpatient GI  Discharge Diagnoses: Principal Problem:   UGIB (upper gastrointestinal bleed) Active Problems:   Iron deficiency anemia   Essential hypertension   Controlled type 2 diabetes mellitus without complication, without long-term current use of insulin (HCC)   HLD (hyperlipidemia)   Hiatal hernia without gangrene or obstruction   Chronic gastric erosion    Hospital Course: Kelly Rios is a 73 y.o. female Jehov[employer]'s Witness with medical history significant of HTN, HLD, iron deficiency anemia, DM2, GERD who presented to [facility] ED on 1/31 by direction of her PCP for low hemoglobin.  Patient has been reporting generalized weakness over the past couple of months.  Reports taking daily aspirin only.  Does admit to dark stools but has also been on iron pills. In the ED, hemoglobin 5.9, anemia panel with iron 18, TIBC high 463.  FOBT positive.  Chest x-ray with no acute cardiopulmonary disease process.  GI was consulted. TRH consulted for admission.    Today, patient denies any new complaints, reports feeling slightly better overall.  Continues to deny any BRBPR, melena, worsening dizziness.  Discussed extensively with patient about the need of more IV infusions, taking oral iron supplements with bowel regimen, and taking PPIs as scheduled, patient verbalized understanding.  Discussed with patient to follow-up outpatient GI as well as PCP for repeat labs and further management.  Patient continues to refuse blood transfusion as she is Jehovah's Witness   Assessment and Plan:  Symptomatic iron deficiency anemia concerning for upper GI bleed Chronic blood loss  anemia Hemoglobin on admission 5.9 (last hemoglobin 14.4 in 2019) Anemia panel with iron 18, TIBC high 463 FOBT positive Complicated by her Jehovah's Witness status, declining blood transfusions IV iron daily x 2 days, will need further IV infusions (discussed with patient to follow-up with PCP or GI to arrange outpatient IV infusion Protonix 40 mg BID GI on board, s/p EGD on 11/19/2023 which showed large hiatal hernia with associated Cameron erosions, normal examined duodenum, no specimens collected.  Recommend following up with outpatient GI for further IV infusions. DC patient on oral iron supplements, with strict bowel regimen Pantoprazole twice daily   Essential hypertension Continue bisoprolol-HCTZ   Hyperlipidemia Continue atorvastatin   Type 2 diabetes mellitus Last A1c is 7 Continue metformin   GERD PPI twice daily   Jehovah's Witness Patient's refusing blood products but agreeable for IV iron   Obesity Lifestyle modification advised    Consultants: GI Procedures performed: EGD on 2/1 Disposition: Home Diet recommendation:  Cardiac diet    DISCHARGE MEDICATION: Allergies as of 11/20/2023       Reactions   Codeine Shortness Of Breath   Couldn't breath Nothing similar to codeine, or derivatives         Medication List     PAUSE taking these medications    aspirin EC 81 MG tablet Wait to take this until your doctor or other care provider tells you to start again. Take 81 mg by mouth daily. Swallow whole.       TAKE these medications    atorvastatin 40 MG tablet Commonly known as: LIPITOR Take 40 mg by mouth daily.   bisoprolol-hydrochlorothiazide 10-6.25 MG  tablet Commonly known as: ZIAC Take 2 tablets by mouth daily.   calcium carbonate 1500 (600 Ca) MG Tabs tablet Commonly known as: OSCAL Take 600 mg of elemental calcium by mouth daily with breakfast.   cholecalciferol 25 MCG (1000 UNIT) tablet Commonly known as: VITAMIN D3 Take 1,000  Units by mouth daily.   cyanocobalamin 500 MCG tablet Commonly known as: VITAMIN B12 Take 500 mcg by mouth daily.   ferrous sulfate 325 (65 FE) MG tablet Take 1 tablet (325 mg total) by mouth daily with breakfast. What changed: when to take this   LEG CRAMP RELIEF PO Take 1 tablet by mouth as needed (leg cramps).   metFORMIN 500 MG tablet Commonly known as: GLUCOPHAGE Take 500 mg by mouth daily with breakfast.   nystatin powder Generic drug: nystatin Apply 1 application  topically every other day.   pantoprazole 40 MG tablet Commonly known as: PROTONIX Take 1 tablet (40 mg total) by mouth 2 (two) times daily.   polyethylene glycol 17 g packet Commonly known as: MiraLax Take 17 g by mouth daily.   Potassium Chloride ER 20 MEQ Tbcr Take 1 tablet by mouth at bedtime.   senna-docusate 8.6-50 MG tablet Commonly known as: Senokot-S Take 1 tablet by mouth at bedtime.   Voltaren Arthritis Pain 1 % Gel Generic drug: diclofenac Sodium Apply 2 g topically daily as needed (arthritis).        Follow-up Information     Fanta, Wayland Salinas, MD. Schedule an appointment as soon as possible for a visit.   Specialty: Internal Medicine Contact information: [address] [place] [address] [phone]                Discharge Exam: [name] Weights   11/18/23 1018  Weight: 94.3 kg   General: NAD  Cardiovascular: S1, S2 present Respiratory: CTAB Abdomen: Soft, nontender, nondistended, bowel sounds present Musculoskeletal: No bilateral pedal edema noted Skin: Normal Psychiatry: Normal mood   Condition at discharge: stable  The results of significant diagnostics from this hospitalization (including imaging, microbiology, ancillary and laboratory) are listed below for reference.   Imaging Studies: DG Chest Portable 1 View Result Date: 11/18/2023 CLINICAL DATA:  Shortness of breath and dizziness. EXAM: PORTABLE CHEST 1 VIEW COMPARISON:  None  Available. FINDINGS: Large hiatal hernia. Normal cardiopericardial silhouette otherwise. No consolidation, pneumothorax or effusion. No edema. Degenerative changes of the spine. Artifact from the patient's clothing. IMPRESSION: Hiatal hernia.  No acute cardiopulmonary disease. Electronically Signed   By: Karen Kays M.D.   On: 11/18/2023 11:11    Microbiology: Results for orders placed or performed during the hospital encounter of 03/13/21  Novel Coronavirus, NAA ([facility])     Status: None   Collection Time: 03/13/21  5:56 PM   Specimen: Nasopharyngeal(NP) swabs in vial transport medium   Nasopharynge  Patient  Result Value Ref Range Status   SARS-CoV-2, NAA Not Detected Not Detected Final    Comment: This nucleic acid amplification test was developed and its performance characteristics determined by [employer]. Nucleic acid amplification tests include RT-PCR and TMA. This test has not been FDA cleared or approved. This test has been authorized by FDA under an Emergency Use Authorization (EUA). This test is only authorized for the duration of time the declaration that circumstances exist justifying the authorization of the emergency use of in vitro diagnostic tests for detection of SARS-CoV-2 virus and/or diagnosis of COVID-19 infection under section 564(b)(1) of the Act, 21 U.S.C. 213YQM-5(H) (1), unless  the authorization is terminated or revoked sooner. When diagnostic testing is negative, the possibility of a false negative result should be considered in the context of a patient's recent exposures and the presence of clinical signs and symptoms consistent with COVID-19. An individual without symptoms of COVID-19 and who is not shedding SARS-CoV-2 virus wo uld expect to have a negative (not detected) result in this assay.   SARS-COV-2, NAA 2 DAY TAT     Status: None   Collection Time: 03/13/21  5:56 PM   Nasopharynge  Patient  Result Value Ref Range Status   SARS-CoV-2,  NAA 2 DAY TAT Performed  Final    Labs: CBC: Recent Labs  Lab 11/18/23 1203 11/19/23 0631 11/20/23 0640  WBC 9.4 7.8 7.2  NEUTROABS 6.4  --   --   HGB 5.9* 5.7* 5.8*  HCT 20.9* 20.4* 21.5*  MCV 76.6* 74.2* 75.4*  PLT 305 314 342   Basic Metabolic Panel: Recent Labs  Lab 11/18/23 1203 11/19/23 0631  NA 137 139  K 4.5 4.0  CL 104 106  CO2 21* 23  GLUCOSE 112* 127*  BUN 19 11  CREATININE 0.78 0.82  CALCIUM 9.3 9.2   Liver Function Tests: Recent Labs  Lab 11/18/23 1203  AST 14*  ALT 14  ALKPHOS 73  BILITOT 0.5  PROT 6.5  ALBUMIN 3.3*   CBG: Recent Labs  Lab 11/19/23 1239 11/19/23 1645 11/19/23 2153 11/20/23 0641 11/20/23 1201  GLUCAP 152* 99 168* 128* 129*    Discharge time spent: greater than 30 minutes.  Signed: Briant Cedar, MD [facility] 11/20/2023

## 2023-11-21 ENCOUNTER — Telehealth: Payer: Self-pay

## 2023-11-21 ENCOUNTER — Other Ambulatory Visit: Payer: Self-pay | Admitting: Internal Medicine

## 2023-11-21 ENCOUNTER — Encounter (HOSPITAL_COMMUNITY): Payer: Self-pay | Admitting: Gastroenterology

## 2023-11-21 NOTE — Telephone Encounter (Signed)
Auth Submission: NO AUTH NEEDED Site of care: Site of care: [facility] Payer: [employer] medicare Medication & CPT/J Code(s) submitted: Venofer (Iron Sucrose) J1756 Route of submission (phone, fax, portal): portal Phone # Fax # Auth type: Buy/Bill PB Units/visits requested: 200mg , 3 doses Reference number: [id number] Approval from: 11/21/23 to 01/16/24

## 2023-11-23 ENCOUNTER — Encounter: Payer: Medicare Other | Attending: Internal Medicine | Admitting: Internal Medicine

## 2023-11-23 VITALS — BP 145/67 | HR 64 | Temp 98.0°F | Resp 16

## 2023-11-23 DIAGNOSIS — D509 Iron deficiency anemia, unspecified: Secondary | ICD-10-CM

## 2023-11-23 DIAGNOSIS — D500 Iron deficiency anemia secondary to blood loss (chronic): Secondary | ICD-10-CM

## 2023-11-23 MED ORDER — ACETAMINOPHEN 325 MG PO TABS
650.0000 mg | ORAL_TABLET | Freq: Once | ORAL | Status: AC
  Administered 2023-11-23: 650 mg via ORAL

## 2023-11-23 MED ORDER — IRON SUCROSE 20 MG/ML IV SOLN
200.0000 mg | Freq: Once | INTRAVENOUS | Status: AC
  Administered 2023-11-23: 200 mg via INTRAVENOUS

## 2023-11-23 MED ORDER — DIPHENHYDRAMINE HCL 25 MG PO CAPS
25.0000 mg | ORAL_CAPSULE | Freq: Once | ORAL | Status: AC
Start: 2023-11-23 — End: 2023-11-23
  Administered 2023-11-23: 25 mg via ORAL

## 2023-11-23 NOTE — Progress Notes (Signed)
 Diagnosis: Iron Deficiency Anemia  Provider:  Katrinka Blazing MD  Procedure: IV Push  IV Type: Peripheral, IV Location: L Antecubital  Venofer (Iron Sucrose), Dose: 200 mg  Post Infusion IV Care: Observation period completed  Discharge: Condition: Good, Destination: Home . AVS Provided  Performed by:  Cleotilde Neer, LPN

## 2023-11-28 ENCOUNTER — Encounter: Payer: Medicare Other | Admitting: Internal Medicine

## 2023-11-28 VITALS — BP 143/59 | HR 52 | Temp 97.5°F | Resp 16

## 2023-11-28 DIAGNOSIS — D500 Iron deficiency anemia secondary to blood loss (chronic): Secondary | ICD-10-CM

## 2023-11-28 DIAGNOSIS — D509 Iron deficiency anemia, unspecified: Secondary | ICD-10-CM

## 2023-11-28 MED ORDER — DIPHENHYDRAMINE HCL 25 MG PO CAPS
25.0000 mg | ORAL_CAPSULE | Freq: Once | ORAL | Status: AC
  Administered 2023-11-28: 25 mg via ORAL

## 2023-11-28 MED ORDER — IRON SUCROSE 20 MG/ML IV SOLN
200.0000 mg | Freq: Once | INTRAVENOUS | Status: AC
  Administered 2023-11-28: 200 mg via INTRAVENOUS

## 2023-11-28 MED ORDER — ACETAMINOPHEN 325 MG PO TABS
650.0000 mg | ORAL_TABLET | Freq: Once | ORAL | Status: AC
  Administered 2023-11-28: 650 mg via ORAL

## 2023-11-28 NOTE — Progress Notes (Signed)
 Diagnosis: Iron  Deficiency Anemia  Provider:  Samantha Cress MD  Procedure: IV Push  IV Type: Peripheral, IV Location: Right Wrist  Venofer  (Iron  Sucrose), Dose: 200 mg  Post Infusion IV Care: Observation period completed  Discharge: Condition: Good, Destination: Home . AVS Provided  Performed by:  Laqueta Plane, LPN

## 2023-11-30 ENCOUNTER — Encounter: Payer: Medicare Other | Admitting: Internal Medicine

## 2023-11-30 VITALS — BP 142/58 | HR 60 | Temp 98.0°F | Resp 16

## 2023-11-30 DIAGNOSIS — D509 Iron deficiency anemia, unspecified: Secondary | ICD-10-CM | POA: Diagnosis not present

## 2023-11-30 DIAGNOSIS — D500 Iron deficiency anemia secondary to blood loss (chronic): Secondary | ICD-10-CM

## 2023-11-30 MED ORDER — ACETAMINOPHEN 325 MG PO TABS
650.0000 mg | ORAL_TABLET | Freq: Once | ORAL | Status: AC
  Administered 2023-11-30: 650 mg via ORAL

## 2023-11-30 MED ORDER — DIPHENHYDRAMINE HCL 25 MG PO CAPS
25.0000 mg | ORAL_CAPSULE | Freq: Once | ORAL | Status: AC
Start: 2023-11-30 — End: 2023-11-30
  Administered 2023-11-30: 25 mg via ORAL

## 2023-11-30 MED ORDER — IRON SUCROSE 20 MG/ML IV SOLN
200.0000 mg | Freq: Once | INTRAVENOUS | Status: AC
  Administered 2023-11-30: 200 mg via INTRAVENOUS

## 2023-11-30 NOTE — Addendum Note (Signed)
Addended by: Cleotilde Neer on: 11/30/2023 11:41 AM   Modules accepted: Orders

## 2023-11-30 NOTE — Progress Notes (Signed)
Diagnosis: Iron Deficiency Anemia  Provider:  Katrinka Blazing MD  Procedure: IV Push  IV Type: Peripheral, IV Location: L Hand  Venofer (Iron Sucrose), Dose: 200 mg  Post Infusion IV Care: Observation period completed  Discharge: Condition: Good, Destination: Home . AVS Provided  Performed by:  Cleotilde Neer, LPN

## 2023-12-05 ENCOUNTER — Telehealth: Payer: Self-pay | Admitting: Gastroenterology

## 2023-12-05 NOTE — Telephone Encounter (Signed)
Returned call to patient. I informed her that her primary GI provider will be monitoring repeat labs moving forward. Pt still has some complaints of fatigue and is wondering if her Hgb has increased after 7 IV infusions. Pt will contact [facility] office for follow up.

## 2023-12-05 NOTE — Telephone Encounter (Signed)
Patient is requesting to speak with someone wanting to follow up on blood work done. Patient is wanting to know how to proceed if she is needing a procedure and having further questions. Please advise.

## 2023-12-07 NOTE — Telephone Encounter (Signed)
Inbound call from patient stating she spoke with office and was advised she needs a referral from Dr. Myrtie Neither office to be sent in order to be seen. Please advise, thank you.

## 2023-12-07 NOTE — Telephone Encounter (Signed)
Patient called and stated that she was wanting to know who is suppose to be following up on her care since her procedure. Patient stated that she spoke to her primary care and they advised her to follow up with her provider that did her procedure. Patient also stated that she was still feeling lightheaded and fatigued. Patient is requesting a call back. Please advise.

## 2023-12-07 NOTE — Telephone Encounter (Signed)
Returned call to patient and informed her that she is supposed to be following up with her primary GI office, [facility]. I provided patient with their office number - [phone]. Pt will call their office to get recommendations regarding follow up labs after her iron infusions. Pt verbalized understanding.

## 2023-12-07 NOTE — Telephone Encounter (Signed)
Patient does not need a referral, Dr. Marletta Lor is her primary GI provider. Last seen by Dr. Marletta Lor in 2023, only seen by Dr. Myrtie Neither as inpatient. We have contacted [facility] and have asked them to contact patient to set up appt.

## 2023-12-07 NOTE — Telephone Encounter (Signed)
Pt has been scheduled with Dr. Marletta Lor on 12/21/23 at 11:10 am.

## 2023-12-21 ENCOUNTER — Other Ambulatory Visit: Payer: Self-pay | Admitting: *Deleted

## 2023-12-21 ENCOUNTER — Ambulatory Visit: Payer: Medicare Other | Admitting: Internal Medicine

## 2023-12-21 ENCOUNTER — Encounter: Payer: Self-pay | Admitting: Internal Medicine

## 2023-12-21 VITALS — BP 176/78 | Temp 98.6°F | Ht 65.5 in | Wt 207.0 lb

## 2023-12-21 DIAGNOSIS — R531 Weakness: Secondary | ICD-10-CM

## 2023-12-21 DIAGNOSIS — K449 Diaphragmatic hernia without obstruction or gangrene: Secondary | ICD-10-CM

## 2023-12-21 DIAGNOSIS — K257 Chronic gastric ulcer without hemorrhage or perforation: Secondary | ICD-10-CM

## 2023-12-21 DIAGNOSIS — K219 Gastro-esophageal reflux disease without esophagitis: Secondary | ICD-10-CM

## 2023-12-21 DIAGNOSIS — D500 Iron deficiency anemia secondary to blood loss (chronic): Secondary | ICD-10-CM

## 2023-12-21 DIAGNOSIS — D509 Iron deficiency anemia, unspecified: Secondary | ICD-10-CM | POA: Diagnosis not present

## 2023-12-21 DIAGNOSIS — R5383 Other fatigue: Secondary | ICD-10-CM

## 2023-12-21 NOTE — Patient Instructions (Signed)
 I am going to check your blood counts and iron studies today at [facility].  We will call with results.  I am also going to refer you to hematology for further evaluation.  Continue on pantoprazole twice daily.  Follow-up in 4 weeks.  It was very nice seeing you again.  Dr. Marletta Lor

## 2023-12-21 NOTE — Progress Notes (Signed)
 Referring Provider: Benetta Spar* Primary Care Physician:  Benetta Spar, MD Primary GI:  Dr. Marletta Lor  Chief Complaint  Patient presents with   Follow-up    Follow up on IDA. Pt has her procedure report that she needs to ask you about    HPI:   Kelly Rios is a 73 y.o. female who presents to clinic today for hospital follow-up visit.  Presented to [facility] 11/18/2023 due to generalized weakness/fatigue, abnormal blood work.  Hemoglobin 5.9.  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  Underwent upper endoscopy 11/19/23 which showed a large hiatal hernia with multiple Cameron erosions.  Stomach and duodenum otherwise unremarkable.  Received 4 doses of IV iron in the hospital and has received 3 doses outpatient since that time.  No repeat blood work since discharge that I can see.  Currently taking pantoprazole 40 mg twice daily  Today, denies any melena hematochezia.  Does note some ongoing generalized weakness/fatigue.  Colonoscopy 12/22/2021 with internal hemorrhoids, diverticulosis, otherwise unremarkable.    Past Medical History:  Diagnosis Date   Anemia    COVID-19 virus infection 06/2020   Diverticulitis    GERD (gastroesophageal reflux disease)    High cholesterol    HTN (hypertension)     Past Surgical History:  Procedure Laterality Date   ABDOMINAL HYSTERECTOMY     CHOLECYSTECTOMY     COLONOSCOPY  08/25/2012   Procedure: COLONOSCOPY;  Surgeon: West Bali, MD;  Location: [facility];  Service: Endoscopy;  Laterality: N/A;  12:30-changed to 12:45 Soledad Gerlach notified   COLONOSCOPY WITH PROPOFOL N/A 12/22/2021   Procedure: COLONOSCOPY WITH PROPOFOL;  Surgeon: Lanelle Bal, DO;  Location: [facility];  Service: Endoscopy;  Laterality: N/A;  12:00 / ASA 2   ENDOVENOUS ABLATION SAPHENOUS VEIN W/ LASER Right 06/28/2018   endovenous laser ablation R GSV by Fabienne Bruns MD    ENDOVENOUS ABLATION SAPHENOUS VEIN W/ LASER Left 07/19/2018   endovenous laser ablation Left greater saphenous vein by Fabienne Bruns MD    ESOPHAGOGASTRODUODENOSCOPY  08/25/2012   Procedure: ESOPHAGOGASTRODUODENOSCOPY (EGD);  Surgeon: West Bali, MD;  Location: [facility];  Service: Endoscopy;  Laterality: N/A;   ESOPHAGOGASTRODUODENOSCOPY (EGD) WITH PROPOFOL N/A 11/19/2023   Procedure: ESOPHAGOGASTRODUODENOSCOPY (EGD) WITH PROPOFOL;  Surgeon: Sherrilyn Rist, MD;  Location: [facility] ENDOSCOPY;  Service: Gastroenterology;  Laterality: N/A;   LASER ABLATION Right 07/28/2023   Laser ablation of the Right greater saphenous vein with >20 stab phlebectomies   PARTIAL HYSTERECTOMY     stab phlebectomy Left 04/25/2019   stab phlebectomy 10-20 incisions left leg by Fabienne Bruns MD    stab phlebectomy Right 05/23/2019   stab phlebectomy 10-20 incisions right leg by Fabienne Bruns MD     Current Outpatient Medications  Medication Sig Dispense Refill   atorvastatin (LIPITOR) 40 MG tablet Take 40 mg by mouth daily.     bisoprolol-hydrochlorothiazide (ZIAC) 10-6.25 MG per tablet Take 2 tablets by mouth daily.     calcium carbonate (OSCAL) 1500 (600 Ca) MG TABS tablet Take 600 mg of elemental calcium by mouth daily with breakfast.     cholecalciferol (VITAMIN D3) 25 MCG (1000 UNIT) tablet Take 1,000 Units by mouth daily.     cyanocobalamin (VITAMIN B12) 500 MCG tablet Take 500 mcg by mouth daily.     diclofenac Sodium (VOLTAREN ARTHRITIS PAIN) 1 % GEL Apply 2 g topically daily  as needed (arthritis).     Homeopathic Products (LEG CRAMP RELIEF PO) Take 1 tablet by mouth as needed (leg cramps).     metFORMIN (GLUCOPHAGE) 500 MG tablet Take 500 mg by mouth daily with breakfast.     NYSTATIN powder Apply 1 application  topically every other day.     pantoprazole (PROTONIX) 40 MG tablet Take 1 tablet (40 mg total) by mouth 2 (two) times daily. 60 tablet 0   polyethylene glycol (MIRALAX) 17 g packet Take  17 g by mouth daily. 14 each 0   Potassium Chloride ER 20 MEQ TBCR Take 1 tablet by mouth at bedtime.     senna-docusate (SENOKOT-S) 8.6-50 MG tablet Take 1 tablet by mouth at bedtime. 30 tablet 0   [Paused] aspirin EC 81 MG tablet Take 81 mg by mouth daily. Swallow whole. (Patient not taking: Reported on 12/21/2023)     ferrous sulfate 325 (65 FE) MG tablet Take 1 tablet (325 mg total) by mouth daily with breakfast. 30 tablet 0   No current facility-administered medications for this visit.    Allergies as of 12/21/2023 - Review Complete 12/21/2023  Allergen Reaction Noted   Codeine Shortness Of Breath 01/05/2011    Family History  Problem Relation Age of Onset   Asthma Other    Diabetes Other    Colon cancer Maternal Grandmother        64s   Liver disease Neg Hx     Social History   Socioeconomic History   Marital status: Married    Spouse name: Not on file   Number of children: 4   Years of education: Not on file   Highest education level: Not on file  Occupational History   Occupation: Chief Technology Officer: [employer]   Occupation: Orthoptist  Tobacco Use   Smoking status: Former    Current packs/day: 0.50    Average packs/day: 0.5 packs/day for 15.0 years (7.5 ttl pk-yrs)    Types: Cigarettes   Smokeless tobacco: Never   Tobacco comments:    remote  Vaping Use   Vaping status: Never Used  Substance and Sexual Activity   Alcohol use: No   Drug use: No   Sexual activity: Not on file  Other Topics Concern   Not on file  Social History Narrative   Not on file   Social Drivers of Health   Financial Resource Strain: Not on file  Food Insecurity: No Food Insecurity (11/19/2023)   Hunger Vital Sign    Worried About Running Out of Food in the Last Year: Never true    Ran Out of Food in the Last Year: Never true  Transportation Needs: No Transportation Needs (11/19/2023)   PRAPARE - Administrator, Civil Service (Medical): No    Lack of Transportation  (Non-Medical): No  Physical Activity: Not on file  Stress: Not on file  Social Connections: Socially Integrated (11/19/2023)   Social Connection and Isolation Panel [NHANES]    Frequency of Communication with Friends and Family: More than three times a week    Frequency of Social Gatherings with Friends and Family: Once a week    Attends Religious Services: More than 4 times per year    Active Member of [employer] or Organizations: Yes    Attends Engineer, structural: More than 4 times per year    Marital Status: Married    Subjective: Review of Systems  Constitutional:  Positive for malaise/fatigue. Negative for chills  and fever.  HENT:  Negative for congestion and hearing loss.   Eyes:  Negative for blurred vision and double vision.  Respiratory:  Negative for cough and shortness of breath.   Cardiovascular:  Negative for chest pain and palpitations.  Gastrointestinal:  Positive for heartburn. Negative for abdominal pain, blood in stool, constipation, diarrhea, melena and vomiting.  Genitourinary:  Negative for dysuria and urgency.  Musculoskeletal:  Negative for joint pain and myalgias.  Skin:  Negative for itching and rash.  Neurological:  Negative for dizziness and headaches.  Psychiatric/Behavioral:  Negative for depression. The patient is not nervous/anxious.      Objective: BP (!) 176/78   Temp 98.6 F (37 C)   Ht 5' 5.5" (1.664 m)   Wt 207 lb (93.9 kg)   BMI 33.92 kg/m  Physical Exam Constitutional:      Appearance: Normal appearance.  HENT:     Head: Normocephalic and atraumatic.  Eyes:     Extraocular Movements: Extraocular movements intact.     Conjunctiva/sclera: Conjunctivae normal.  Cardiovascular:     Rate and Rhythm: Normal rate and regular rhythm.  Pulmonary:     Effort: Pulmonary effort is normal.     Breath sounds: Normal breath sounds.  Abdominal:     General: Bowel sounds are normal.     Palpations: Abdomen is soft.  Musculoskeletal:         General: No swelling. Normal range of motion.     Cervical back: Normal range of motion and neck supple.  Skin:    General: Skin is warm and dry.     Coloration: Skin is not jaundiced.  Neurological:     General: No focal deficit present.     Mental Status: She is alert and oriented to person, place, and time.  Psychiatric:        Mood and Affect: Mood normal.        Behavior: Behavior normal.      Assessment/Plan:  1.  Iron deficiency anemia-likely due to chronic blood loss in the setting of haital hernia with Sheria Lang lesions.  Will maximize acid suppression with PPI twice daily.  Check CBC and iron studies today.  Complicated case as patient is a Jehovah's Witness, unable to receive PRBCs.  Will refer to hematology for further assistance.  Appreciate them very much.  If iron studies/wound do not improve with chronic iron therapy, we may need to consider capsule endoscopy via EGD to evaluate small bowel to ensure no other possible lesions causing blood loss.  May need to also consider surgical referral for hiatal hernia repair.  2.  Chronic GERD-worse in the setting of large hiatal hernia.  Continue PPI twice daily.  3.  Generalized weakness/fatigue-likely due to anemia.  Continue to monitor.  Follow-up in 4 weeks.  12/21/2023 11:39 AM   Disclaimer: This note was dictated with voice recognition software. Similar sounding words can inadvertently be transcribed and may not be corrected upon review.

## 2023-12-22 LAB — IRON,TIBC AND FERRITIN PANEL
Ferritin: 69 ng/mL (ref 15–150)
Iron Saturation: 46 % (ref 15–55)
Iron: 159 ug/dL — ABNORMAL HIGH (ref 27–139)
Total Iron Binding Capacity: 342 ug/dL (ref 250–450)
UIBC: 183 ug/dL (ref 118–369)

## 2023-12-22 LAB — CBC
Hematocrit: 42.1 % (ref 34.0–46.6)
Hemoglobin: 12.4 g/dL (ref 11.1–15.9)
MCH: 25.3 pg — ABNORMAL LOW (ref 26.6–33.0)
MCHC: 29.5 g/dL — ABNORMAL LOW (ref 31.5–35.7)
MCV: 86 fL (ref 79–97)
Platelets: 238 10*3/uL (ref 150–450)
RBC: 4.9 x10E6/uL (ref 3.77–5.28)
WBC: 4.2 10*3/uL (ref 3.4–10.8)

## 2023-12-28 ENCOUNTER — Encounter: Payer: Self-pay | Admitting: Oncology

## 2023-12-28 ENCOUNTER — Inpatient Hospital Stay

## 2023-12-28 ENCOUNTER — Inpatient Hospital Stay: Attending: Oncology | Admitting: Oncology

## 2023-12-28 VITALS — BP 164/75 | HR 63 | Temp 97.6°F | Resp 16 | Ht 65.16 in | Wt 206.2 lb

## 2023-12-28 DIAGNOSIS — Z79899 Other long term (current) drug therapy: Secondary | ICD-10-CM | POA: Insufficient documentation

## 2023-12-28 DIAGNOSIS — Z87891 Personal history of nicotine dependence: Secondary | ICD-10-CM | POA: Insufficient documentation

## 2023-12-28 DIAGNOSIS — D509 Iron deficiency anemia, unspecified: Secondary | ICD-10-CM | POA: Insufficient documentation

## 2023-12-28 DIAGNOSIS — D500 Iron deficiency anemia secondary to blood loss (chronic): Secondary | ICD-10-CM

## 2023-12-28 NOTE — Progress Notes (Signed)
 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 female referred for iron deficiency anemia. Patient was recently presented on 11/18/2023 due to generalized weakness/fatigue and was found to have a hemoglobin of 5.9.  She was then transferred to [facility] as patient is Jehovah's Witness and is unable to receive packed red blood cells.  She had a upper endoscopy done on 11/19/2023 which showed a large hiatal hernia with multiple Cameron erosions.  Patient received 2 doses of Ferrlecit 125 mg and 3 doses of Venofer 200 mg before discharge.  Labs at this time showed resolution of anemia and iron deficiency. Patient has no complaints today.  Reported she is feeling significantly better since the hospital discharge.  He is eating healthy and is taking care of her husband.  She is a previous smoker for 40 years, denies drinking.  Lives with her husband.  I have reviewed the past medical history, past surgical history, social history and family history with the patient   ALLERGIES:  is allergic to codeine.  MEDICATIONS:  Current Outpatient Medications  Medication Sig Dispense Refill   [Paused] aspirin EC 81 MG tablet Take 81 mg by mouth daily. Swallow whole.     atorvastatin (LIPITOR) 40 MG tablet Take 40 mg by mouth daily.     bisoprolol-hydrochlorothiazide (ZIAC) 10-6.25 MG per tablet Take 2 tablets by mouth daily.     calcium carbonate (OSCAL) 1500 (600 Ca) MG TABS tablet Take 600 mg of elemental calcium by mouth daily with breakfast.     cholecalciferol (VITAMIN D3) 25 MCG (1000 UNIT) tablet Take 1,000 Units by mouth daily.     diclofenac Sodium (VOLTAREN ARTHRITIS PAIN) 1 % GEL Apply 2 g topically daily as needed (arthritis).     Homeopathic Products (LEG  CRAMP RELIEF PO) Take 1 tablet by mouth as needed (leg cramps).     metFORMIN (GLUCOPHAGE) 500 MG tablet Take 500 mg by mouth daily with breakfast.     NYSTATIN powder Apply 1 application  topically every other day.     pantoprazole (PROTONIX) 40 MG tablet Take 1 tablet (40 mg total) by mouth 2 (two) times daily. 60 tablet 0   polyethylene glycol (MIRALAX) 17 g packet Take 17 g by mouth daily. 14 each 0   Potassium Chloride ER 20 MEQ TBCR Take 1 tablet by mouth at bedtime.     senna-docusate (SENOKOT-S) 8.6-50 MG tablet Take 1 tablet by mouth at bedtime. 30 tablet 0   ferrous sulfate 325 (65 FE) MG tablet Take 1 tablet (325 mg total) by mouth daily with breakfast. 30 tablet 0   No current facility-administered medications for this visit.     REVIEW OF SYSTEMS:   Constitutional: Denies fevers, chills or night sweats Eyes: Denies blurriness of vision Ears, nose, mouth, throat, and face: Denies mucositis or sore throat Respiratory: Denies cough, dyspnea or wheezes Cardiovascular: Denies palpitation, chest discomfort or lower extremity swelling Gastrointestinal:  Denies nausea, heartburn or change in bowel habits Skin: Denies abnormal skin rashes Lymphatics: Denies new lymphadenopathy or easy bruising Neurological:Denies numbness, tingling or new weaknesses Behavioral/Psych: Mood is stable, no new changes  All other systems were reviewed with the patient and are negative.  PHYSICAL EXAMINATION:   Vitals:   12/28/23 1345  BP: Marland Kitchen)  164/75  Pulse: 63  Resp: 16  Temp: 97.6 F (36.4 C)  SpO2: 93%    GENERAL:alert, no distress and comfortable LUNGS: clear to auscultation and percussion with normal breathing effort HEART: regular rate & rhythm and no murmurs and no lower extremity edema ABDOMEN:abdomen soft, non-tender and normal bowel sounds Musculoskeletal:no cyanosis of digits and no clubbing  NEURO: alert & oriented x 3 with fluent speech  LABORATORY DATA:  I have reviewed the  data as listed  Lab Results  Component Value Date   WBC 4.2 12/21/2023   NEUTROABS 6.4 11/18/2023   HGB 12.4 12/21/2023   HCT 42.1 12/21/2023   MCV 86 12/21/2023   PLT 238 12/21/2023       Chemistry      Component Value Date/Time   NA 139 11/19/2023 0631   K 4.0 11/19/2023 0631   CL 106 11/19/2023 0631   CO2 23 11/19/2023 0631   BUN 11 11/19/2023 0631   CREATININE 0.82 11/19/2023 0631      Component Value Date/Time   CALCIUM 9.2 11/19/2023 0631   ALKPHOS 73 11/18/2023 1203   AST 14 (L) 11/18/2023 1203   ALT 14 11/18/2023 1203   BILITOT 0.5 11/18/2023 1203      Latest Reference Range & Units 12/21/23 12:26  Iron 27 - 139 ug/dL 161 (H)  UIBC 096 - 045 ug/dL 409  TIBC 811 - 914 ug/dL 782  Ferritin 15 - 956 ng/mL 69  Iron Saturation 15 - 55 % 46  (H): Data is abnormally high  Endoscopy:11/19/23 Impression:  - Large hiatal hernia.  - Gastric erosions with no bleeding and no stigmata of recent bleeding.  - Normal examined duodenum.  - No specimens collected.     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    Iron deficiency anemia Likely secondary to chronic GI blood loss.  Currently anemia resolved and iron labs within normal limits.  Patient is asymptomatic and GI bleeding has resolved. -Repeat labs in 3 months and if the patient is still iron deficient will replace as needed at that time -Continue pantoprazole as prescribed by GI -Continue to follow with GI  Return to clinic in 3 months with labs   Orders Placed This Encounter  Procedures   CBC with Differential/Platelet    Standing Status:   Future    Expected Date:   03/26/2024    Expiration Date:   01/01/2025   Comprehensive metabolic panel    Standing Status:   Future    Expected Date:   03/26/2024    Expiration Date:   01/01/2025   Ferritin    Standing Status:   Future    Expected Date:   03/26/2024    Expiration Date:   01/01/2025    Folate    Standing Status:   Future    Expected Date:   03/26/2024    Expiration Date:   01/01/2025   Vitamin B12    Standing Status:   Future    Expected Date:   03/26/2024    Expiration Date:   01/01/2025   Iron and TIBC    Standing Status:   Future    Expected Date:   03/26/2024    Expiration Date:   01/01/2025    The total time spent in the appointment was 40 minutes encounter with patients including review of chart and various tests results, discussions about plan of care and coordination  of care plan   All questions were answered. The patient knows to call the clinic with any problems, questions or concerns. No barriers to learning was detected.   Cindie Crumbly, MD 3/17/202511:32 AM

## 2023-12-28 NOTE — Patient Instructions (Signed)
 VISIT SUMMARY:  During today's visit, we discussed your hiatal hernia, which previously caused bleeding and required iron infusions. We also reviewed your history of diverticulitis and its management. Additionally, we talked about the stress you are experiencing while caring for your husband.  YOUR PLAN:  -IRON DEFICIENCY ANEMIA: Iron deficiency anemia happens when your body doesn't have enough iron to produce adequate hemoglobin, often due to bleeding. Your hemoglobin levels have improved with iron infusions. We will monitor your hemoglobin and iron levels in three months, and you should eat more iron-rich foods.   INSTRUCTIONS:  Please follow up in three months for lab tests to monitor your hemoglobin and iron levels. If you experience any worsening symptoms or new issues, contact our office immediately.

## 2024-01-02 NOTE — Assessment & Plan Note (Signed)
 Likely secondary to chronic GI blood loss.  Currently anemia resolved and iron labs within normal limits.  Patient is asymptomatic and GI bleeding has resolved. -Repeat labs in 3 months and if the patient is still iron deficient will replace as needed at that time -Continue pantoprazole as prescribed by GI -Continue to follow with GI  Return to clinic in 3 months with labs

## 2024-01-04 ENCOUNTER — Telehealth: Payer: Self-pay | Admitting: *Deleted

## 2024-01-04 NOTE — Telephone Encounter (Signed)
 Pt called and would like to know when she could return to work. Please advise

## 2024-01-09 NOTE — Telephone Encounter (Signed)
 Spoke to pt and informed her that there is nothing in her ov note stating that she was to be out of work. Pt stated that she hadn't heard anything after having her endoscopy. I advised the pt to contact the office of the provider that did her endoscopy regarding when she is to return to work that we never took her out of work. Pt verbalized understanding.

## 2024-01-17 ENCOUNTER — Telehealth (INDEPENDENT_AMBULATORY_CARE_PROVIDER_SITE_OTHER): Payer: Self-pay | Admitting: Internal Medicine

## 2024-01-17 NOTE — Telephone Encounter (Signed)
 Spoke to pt and informed her that Dr. Marletta Lor hadn't taken her out of work and as far as our office is concerned she is able to go back to work. Pt verbalized understanding.

## 2024-01-17 NOTE — Telephone Encounter (Signed)
 Pt came to Main office asking to speak to Dr Queen Blossom nurse. I told her that we had one nurse here today with patients and that Dr Queen Blossom nurse was at the [facility]. Office. She would not tell me what it was regarding and said that his nurse would know. I told her I would forward a note to [place] and have whomever is covering for Dena to call her. [phone]

## 2024-01-17 NOTE — Telephone Encounter (Signed)
 Kelly Rios

## 2024-03-27 ENCOUNTER — Inpatient Hospital Stay: Attending: Oncology

## 2024-03-27 DIAGNOSIS — D509 Iron deficiency anemia, unspecified: Secondary | ICD-10-CM | POA: Diagnosis present

## 2024-03-27 DIAGNOSIS — Z79899 Other long term (current) drug therapy: Secondary | ICD-10-CM | POA: Diagnosis not present

## 2024-03-27 DIAGNOSIS — D500 Iron deficiency anemia secondary to blood loss (chronic): Secondary | ICD-10-CM

## 2024-03-27 LAB — CBC WITH DIFFERENTIAL/PLATELET
Abs Immature Granulocytes: 0.03 10*3/uL (ref 0.00–0.07)
Basophils Absolute: 0.1 10*3/uL (ref 0.0–0.1)
Basophils Relative: 1 %
Eosinophils Absolute: 0.2 10*3/uL (ref 0.0–0.5)
Eosinophils Relative: 5 %
HCT: 47.7 % — ABNORMAL HIGH (ref 36.0–46.0)
Hemoglobin: 14.5 g/dL (ref 12.0–15.0)
Immature Granulocytes: 1 %
Lymphocytes Relative: 29 %
Lymphs Abs: 1.5 10*3/uL (ref 0.7–4.0)
MCH: 27 pg (ref 26.0–34.0)
MCHC: 30.4 g/dL (ref 30.0–36.0)
MCV: 88.8 fL (ref 80.0–100.0)
Monocytes Absolute: 0.5 10*3/uL (ref 0.1–1.0)
Monocytes Relative: 10 %
Neutro Abs: 3 10*3/uL (ref 1.7–7.7)
Neutrophils Relative %: 54 %
Platelets: 211 10*3/uL (ref 150–400)
RBC: 5.37 MIL/uL — ABNORMAL HIGH (ref 3.87–5.11)
RDW: 14 % (ref 11.5–15.5)
WBC: 5.4 10*3/uL (ref 4.0–10.5)
nRBC: 0 % (ref 0.0–0.2)

## 2024-03-27 LAB — COMPREHENSIVE METABOLIC PANEL WITH GFR
ALT: 18 U/L (ref 0–44)
AST: 18 U/L (ref 15–41)
Albumin: 3.8 g/dL (ref 3.5–5.0)
Alkaline Phosphatase: 103 U/L (ref 38–126)
Anion gap: 10 (ref 5–15)
BUN: 14 mg/dL (ref 8–23)
CO2: 26 mmol/L (ref 22–32)
Calcium: 9.8 mg/dL (ref 8.9–10.3)
Chloride: 104 mmol/L (ref 98–111)
Creatinine, Ser: 0.85 mg/dL (ref 0.44–1.00)
GFR, Estimated: >60 mL/min (ref >60)
Glucose, Bld: 161 mg/dL — ABNORMAL HIGH (ref 70–99)
Potassium: 4 mmol/L (ref 3.5–5.1)
Sodium: 140 mmol/L (ref 135–145)
Total Bilirubin: 0.5 mg/dL (ref 0.0–1.2)
Total Protein: 7.8 g/dL (ref 6.5–8.1)

## 2024-03-27 LAB — IRON AND TIBC
Iron: 46 ug/dL (ref 28–170)
Saturation Ratios: 11 % (ref 10.4–31.8)
TIBC: 411 ug/dL (ref 250–450)
UIBC: 365 ug/dL

## 2024-03-27 LAB — VITAMIN B12: Vitamin B-12: 747 pg/mL (ref 180–914)

## 2024-03-27 LAB — FOLATE: Folate: 11.9 ng/mL (ref >5.9)

## 2024-03-27 LAB — FERRITIN: Ferritin: 14 ng/mL (ref 11–307)

## 2024-03-28 ENCOUNTER — Inpatient Hospital Stay

## 2024-04-02 ENCOUNTER — Other Ambulatory Visit (HOSPITAL_COMMUNITY): Payer: Self-pay | Admitting: Gerontology

## 2024-04-02 DIAGNOSIS — N951 Menopausal and female climacteric states: Secondary | ICD-10-CM

## 2024-04-04 ENCOUNTER — Inpatient Hospital Stay: Admitting: Oncology

## 2024-04-04 VITALS — BP 158/83 | HR 71 | Temp 97.7°F | Resp 18 | Wt 208.5 lb

## 2024-04-04 DIAGNOSIS — D509 Iron deficiency anemia, unspecified: Secondary | ICD-10-CM | POA: Diagnosis not present

## 2024-04-04 DIAGNOSIS — D500 Iron deficiency anemia secondary to blood loss (chronic): Secondary | ICD-10-CM

## 2024-04-04 NOTE — Assessment & Plan Note (Signed)
 Likely secondary to chronic GI blood loss.  Currently anemia resolved and iron  labs within normal limits.  Patient is asymptomatic and GI bleeding has resolved.  -Continue pantoprazole  as prescribed by GI -Continue to follow with GI - No need of further iron  supplementation for the patient as it causes constipation.  No further hematological needs at this time.  Will discharge patient from the clinic and recommended her to reach out to us  to check with questions or concerns

## 2024-04-04 NOTE — Progress Notes (Signed)
 [facility] at [facility]  HEMATOLOGY FOLLOW-UP VISIT  Fanta, [name], MD  REASON FOR FOLLOW-UP: Iron  deficiency  ASSESSMENT & PLAN:  Patient is a 73 y.o. female following for iron  deficiency  Iron  deficiency anemia Likely secondary to chronic GI blood loss.  Currently anemia resolved and iron  labs within normal limits.  Patient is asymptomatic and GI bleeding has resolved.  -Continue pantoprazole  as prescribed by GI -Continue to follow with GI - No need of further iron  supplementation for the patient as it causes constipation.  No further hematological needs at this time.  Will discharge patient from the clinic and recommended her to reach out to us  to check with questions or concerns   No orders of the defined types were placed in this encounter.   The total time spent in the appointment was 20 minutes encounter with patients including review of chart and various tests results, discussions about plan of care and coordination of care plan   All questions were answered. The patient knows to call the clinic with any problems, questions or concerns. No barriers to learning was detected.  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 female following for iron  deficiency anemia.  Patient reported feeling very well today.  She has no other complaints today.  She denies hematochezia, melena, abdominal pain, nausea, vomiting.  I have reviewed the past medical history, past surgical history, social history and family history with the patient   ALLERGIES:  is allergic to codeine.  MEDICATIONS:  Current Outpatient Medications  Medication Sig Dispense Refill   amLODipine (NORVASC) 5 MG tablet Take 5 mg by mouth daily.     [Paused] aspirin EC 81 MG tablet Take 81  mg by mouth daily. Swallow whole.     atorvastatin  (LIPITOR) 40 MG tablet Take 40 mg by mouth daily.     bisoprolol-hydrochlorothiazide (ZIAC) 10-6.25 MG per tablet Take 2 tablets by mouth daily.     calcium  carbonate (OSCAL) 1500 (600 Ca) MG TABS tablet Take 600 mg of elemental calcium  by mouth daily with breakfast.     cholecalciferol (VITAMIN D3) 25 MCG (1000 UNIT) tablet Take 1,000 Units by mouth daily.     diclofenac Sodium (VOLTAREN ARTHRITIS PAIN) 1 % GEL Apply 2 g topically daily as needed (arthritis).     ferrous sulfate  325 (65 FE) MG tablet Take 1 tablet (325 mg total) by mouth daily with breakfast. 30 tablet 0   Homeopathic Products (LEG CRAMP RELIEF PO) Take 1 tablet by mouth as needed (leg cramps).     losartan (COZAAR) 100 MG tablet Take 100 mg by mouth daily.     metFORMIN (GLUCOPHAGE) 500 MG tablet Take 500 mg by mouth daily with breakfast.     NYSTATIN powder Apply 1 application  topically every other day.     pantoprazole  (PROTONIX ) 40 MG tablet Take 1 tablet (40 mg total) by mouth 2 (two) times daily. 60 tablet 0   polyethylene glycol (MIRALAX ) 17 g packet Take 17 g by mouth daily. 14 each 0   Potassium Chloride ER 20 MEQ TBCR Take 1 tablet by mouth at bedtime.     potassium chloride SA (KLOR-CON M) 20 MEQ tablet Take 20 mEq by mouth daily.     senna-docusate (SENOKOT-S) 8.6-50 MG tablet Take 1 tablet  by mouth at bedtime. 30 tablet 0   No current facility-administered medications for this visit.     REVIEW OF SYSTEMS:   Constitutional: Denies fevers, chills or night sweats Eyes: Denies blurriness of vision Ears, nose, mouth, throat, and face: Denies mucositis or sore throat Respiratory: Denies cough, dyspnea or wheezes Cardiovascular: Denies palpitation, chest discomfort or lower extremity swelling Gastrointestinal:  Denies nausea, heartburn or change in bowel habits Skin: Denies abnormal skin rashes Lymphatics: Denies new lymphadenopathy or easy  bruising Neurological:Denies numbness, tingling or new weaknesses Behavioral/Psych: Mood is stable, no new changes  All other systems were reviewed with the patient and are negative.  PHYSICAL EXAMINATION:   Vitals:   04/04/24 1415  BP: (!) 158/83  Pulse: 71  Resp: 18  Temp: 97.7 F (36.5 C)  SpO2: 98%    GENERAL:alert, no distress and comfortable SKIN: skin color, texture, turgor are normal, no rashes or significant lesions LUNGS: clear to auscultation and percussion with normal breathing effort HEART: regular rate & rhythm and no murmurs and no lower extremity edema ABDOMEN:abdomen soft, non-tender and normal bowel sounds Musculoskeletal:no cyanosis of digits and no clubbing  NEURO: alert & oriented x 3 with fluent speech  LABORATORY DATA:  I have reviewed the data as listed  Lab Results  Component Value Date   WBC 5.4 03/27/2024   NEUTROABS 3.0 03/27/2024   HGB 14.5 03/27/2024   HCT 47.7 (H) 03/27/2024   MCV 88.8 03/27/2024   PLT 211 03/27/2024       Chemistry      Component Value Date/Time   NA 140 03/27/2024 0823   K 4.0 03/27/2024 0823   CL 104 03/27/2024 0823   CO2 26 03/27/2024 0823   BUN 14 03/27/2024 0823   CREATININE 0.85 03/27/2024 0823      Component Value Date/Time   CALCIUM  9.8 03/27/2024 0823   ALKPHOS 103 03/27/2024 0823   AST 18 03/27/2024 0823   ALT 18 03/27/2024 0823   BILITOT 0.5 03/27/2024 0823      Latest Reference Range & Units 03/27/24 08:23  Iron  28 - 170 ug/dL 46  UIBC ug/dL 161  TIBC 096 - 045 ug/dL 409  Saturation Ratios 10.4 - 31.8 % 11  Ferritin 11 - 307 ng/mL 14  Folate >5.9 ng/mL 11.9  Vitamin B12 180 - 914 pg/mL 747   Endoscopy:11/19/23 Impression:  - Large hiatal hernia.  - Gastric erosions with no bleeding and no stigmata of recent bleeding.  - Normal examined duodenum.  - No specimens collected.   RADIOGRAPHIC STUDIES: I have personally reviewed the radiological images as listed and agreed with the findings  in the report.  None to review

## 2024-12-05 ENCOUNTER — Ambulatory Visit: Admitting: Internal Medicine
